# Patient Record
Sex: Female | Born: 1945
Health system: Southern US, Community
[De-identification: ages and names within clinical notes are randomized; demographics above are authoritative.]

## PROBLEM LIST (undated history)

## (undated) DIAGNOSIS — K5790 Diverticulosis of intestine, part unspecified, without perforation or abscess without bleeding: Secondary | ICD-10-CM

## (undated) DIAGNOSIS — M8580 Other specified disorders of bone density and structure, unspecified site: Secondary | ICD-10-CM

## (undated) DIAGNOSIS — N182 Chronic kidney disease, stage 2 (mild): Secondary | ICD-10-CM

## (undated) DIAGNOSIS — Z860101 Personal history of adenomatous and serrated colon polyps: Secondary | ICD-10-CM

## (undated) DIAGNOSIS — G5603 Carpal tunnel syndrome, bilateral upper limbs: Secondary | ICD-10-CM

## (undated) DIAGNOSIS — E782 Mixed hyperlipidemia: Secondary | ICD-10-CM

## (undated) DIAGNOSIS — Z86010 Personal history of colonic polyps: Secondary | ICD-10-CM

## (undated) DIAGNOSIS — M1990 Unspecified osteoarthritis, unspecified site: Secondary | ICD-10-CM

## (undated) DIAGNOSIS — R002 Palpitations: Secondary | ICD-10-CM

## (undated) DIAGNOSIS — F411 Generalized anxiety disorder: Secondary | ICD-10-CM

## (undated) DIAGNOSIS — I341 Nonrheumatic mitral (valve) prolapse: Secondary | ICD-10-CM

## (undated) DIAGNOSIS — C4491 Basal cell carcinoma of skin, unspecified: Secondary | ICD-10-CM

## (undated) DIAGNOSIS — Z800 Family history of malignant neoplasm of digestive organs: Secondary | ICD-10-CM

## (undated) DIAGNOSIS — I7300 Raynaud's syndrome without gangrene: Secondary | ICD-10-CM

## (undated) DIAGNOSIS — J309 Allergic rhinitis, unspecified: Secondary | ICD-10-CM

## (undated) DIAGNOSIS — I10 Essential (primary) hypertension: Secondary | ICD-10-CM

## (undated) HISTORY — DX: Other specified disorders of bone density and structure, unspecified site: M85.80

## (undated) HISTORY — DX: Basal cell carcinoma of skin, unspecified: C44.91

## (undated) HISTORY — DX: Raynaud's syndrome without gangrene: I73.00

## (undated) HISTORY — DX: Chronic kidney disease, stage 2 (mild): N18.2

## (undated) HISTORY — PX: OTHER SURGICAL HISTORY: SHX169

## (undated) HISTORY — DX: Personal history of adenomatous and serrated colon polyps: Z86.0101

## (undated) HISTORY — DX: Palpitations: R00.2

## (undated) HISTORY — DX: Essential (primary) hypertension: I10

## (undated) HISTORY — DX: Unspecified osteoarthritis, unspecified site: M19.90

## (undated) HISTORY — DX: Personal history of colon polyps: Z86.010

## (undated) HISTORY — DX: Carpal tunnel syndrome, bilateral upper limbs: G56.03

## (undated) HISTORY — DX: Family history of malignant neoplasm of digestive organs: Z80.0

## (undated) HISTORY — DX: Allergic rhinitis, unspecified: J30.9

## (undated) HISTORY — DX: Generalized anxiety disorder: F41.1

## (undated) HISTORY — DX: Diverticulosis of intestine, part unspecified, without perforation or abscess without bleeding: K57.90

## (undated) HISTORY — DX: Mixed hyperlipidemia: E78.2

## (undated) HISTORY — DX: Nonrheumatic mitral (valve) prolapse: I34.1

---

## 1977-11-11 HISTORY — PX: ABDOMINAL HYSTERECTOMY: SHX81

## 1977-11-11 HISTORY — PX: APPENDECTOMY: SHX54

## 1998-12-21 ENCOUNTER — Other Ambulatory Visit: Admission: RE | Admit: 1998-12-21 | Discharge: 1998-12-21 | Payer: Self-pay | Admitting: Obstetrics and Gynecology

## 2000-02-29 ENCOUNTER — Other Ambulatory Visit: Admission: RE | Admit: 2000-02-29 | Discharge: 2000-02-29 | Payer: Self-pay | Admitting: Obstetrics and Gynecology

## 2001-03-05 ENCOUNTER — Other Ambulatory Visit: Admission: RE | Admit: 2001-03-05 | Discharge: 2001-03-05 | Payer: Self-pay | Admitting: *Deleted

## 2005-11-11 HISTORY — PX: CARDIOVASCULAR STRESS TEST: SHX262

## 2011-12-13 HISTORY — PX: COLONOSCOPY: SHX174

## 2013-07-12 HISTORY — PX: CATARACT EXTRACTION: SUR2

## 2013-11-22 DIAGNOSIS — F411 Generalized anxiety disorder: Secondary | ICD-10-CM | POA: Diagnosis not present

## 2013-11-22 DIAGNOSIS — Z0000 Encounter for general adult medical examination without abnormal findings: Secondary | ICD-10-CM | POA: Diagnosis not present

## 2013-11-22 DIAGNOSIS — E782 Mixed hyperlipidemia: Secondary | ICD-10-CM | POA: Diagnosis not present

## 2013-12-17 DIAGNOSIS — IMO0002 Reserved for concepts with insufficient information to code with codable children: Secondary | ICD-10-CM | POA: Diagnosis not present

## 2013-12-17 DIAGNOSIS — M5137 Other intervertebral disc degeneration, lumbosacral region: Secondary | ICD-10-CM | POA: Diagnosis not present

## 2013-12-17 DIAGNOSIS — M5030 Other cervical disc degeneration, unspecified cervical region: Secondary | ICD-10-CM | POA: Diagnosis not present

## 2014-01-04 DIAGNOSIS — IMO0002 Reserved for concepts with insufficient information to code with codable children: Secondary | ICD-10-CM | POA: Diagnosis not present

## 2014-01-04 DIAGNOSIS — M5030 Other cervical disc degeneration, unspecified cervical region: Secondary | ICD-10-CM | POA: Diagnosis not present

## 2014-01-04 DIAGNOSIS — M5137 Other intervertebral disc degeneration, lumbosacral region: Secondary | ICD-10-CM | POA: Diagnosis not present

## 2014-01-31 DIAGNOSIS — Z961 Presence of intraocular lens: Secondary | ICD-10-CM | POA: Diagnosis not present

## 2014-02-10 DIAGNOSIS — M5030 Other cervical disc degeneration, unspecified cervical region: Secondary | ICD-10-CM | POA: Diagnosis not present

## 2014-02-10 DIAGNOSIS — IMO0002 Reserved for concepts with insufficient information to code with codable children: Secondary | ICD-10-CM | POA: Diagnosis not present

## 2014-02-10 DIAGNOSIS — M5137 Other intervertebral disc degeneration, lumbosacral region: Secondary | ICD-10-CM | POA: Diagnosis not present

## 2014-03-18 DIAGNOSIS — M5137 Other intervertebral disc degeneration, lumbosacral region: Secondary | ICD-10-CM | POA: Diagnosis not present

## 2014-03-18 DIAGNOSIS — M5030 Other cervical disc degeneration, unspecified cervical region: Secondary | ICD-10-CM | POA: Diagnosis not present

## 2014-03-18 DIAGNOSIS — IMO0002 Reserved for concepts with insufficient information to code with codable children: Secondary | ICD-10-CM | POA: Diagnosis not present

## 2014-04-06 DIAGNOSIS — IMO0002 Reserved for concepts with insufficient information to code with codable children: Secondary | ICD-10-CM | POA: Diagnosis not present

## 2014-04-06 DIAGNOSIS — M5030 Other cervical disc degeneration, unspecified cervical region: Secondary | ICD-10-CM | POA: Diagnosis not present

## 2014-04-06 DIAGNOSIS — M5137 Other intervertebral disc degeneration, lumbosacral region: Secondary | ICD-10-CM | POA: Diagnosis not present

## 2014-06-14 DIAGNOSIS — M5030 Other cervical disc degeneration, unspecified cervical region: Secondary | ICD-10-CM | POA: Diagnosis not present

## 2014-06-14 DIAGNOSIS — M999 Biomechanical lesion, unspecified: Secondary | ICD-10-CM | POA: Diagnosis not present

## 2014-06-14 DIAGNOSIS — M9981 Other biomechanical lesions of cervical region: Secondary | ICD-10-CM | POA: Diagnosis not present

## 2014-06-14 DIAGNOSIS — M545 Low back pain, unspecified: Secondary | ICD-10-CM | POA: Diagnosis not present

## 2014-06-16 DIAGNOSIS — M9981 Other biomechanical lesions of cervical region: Secondary | ICD-10-CM | POA: Diagnosis not present

## 2014-06-16 DIAGNOSIS — M999 Biomechanical lesion, unspecified: Secondary | ICD-10-CM | POA: Diagnosis not present

## 2014-06-16 DIAGNOSIS — M5030 Other cervical disc degeneration, unspecified cervical region: Secondary | ICD-10-CM | POA: Diagnosis not present

## 2014-06-16 DIAGNOSIS — M545 Low back pain, unspecified: Secondary | ICD-10-CM | POA: Diagnosis not present

## 2014-06-20 DIAGNOSIS — M999 Biomechanical lesion, unspecified: Secondary | ICD-10-CM | POA: Diagnosis not present

## 2014-06-20 DIAGNOSIS — M545 Low back pain, unspecified: Secondary | ICD-10-CM | POA: Diagnosis not present

## 2014-06-20 DIAGNOSIS — M9981 Other biomechanical lesions of cervical region: Secondary | ICD-10-CM | POA: Diagnosis not present

## 2014-06-20 DIAGNOSIS — M5030 Other cervical disc degeneration, unspecified cervical region: Secondary | ICD-10-CM | POA: Diagnosis not present

## 2014-06-22 DIAGNOSIS — M999 Biomechanical lesion, unspecified: Secondary | ICD-10-CM | POA: Diagnosis not present

## 2014-06-22 DIAGNOSIS — M545 Low back pain, unspecified: Secondary | ICD-10-CM | POA: Diagnosis not present

## 2014-06-22 DIAGNOSIS — M9981 Other biomechanical lesions of cervical region: Secondary | ICD-10-CM | POA: Diagnosis not present

## 2014-06-22 DIAGNOSIS — M5030 Other cervical disc degeneration, unspecified cervical region: Secondary | ICD-10-CM | POA: Diagnosis not present

## 2014-06-24 DIAGNOSIS — M545 Low back pain, unspecified: Secondary | ICD-10-CM | POA: Diagnosis not present

## 2014-06-24 DIAGNOSIS — M5030 Other cervical disc degeneration, unspecified cervical region: Secondary | ICD-10-CM | POA: Diagnosis not present

## 2014-06-24 DIAGNOSIS — M999 Biomechanical lesion, unspecified: Secondary | ICD-10-CM | POA: Diagnosis not present

## 2014-06-24 DIAGNOSIS — M9981 Other biomechanical lesions of cervical region: Secondary | ICD-10-CM | POA: Diagnosis not present

## 2014-07-07 DIAGNOSIS — Z01419 Encounter for gynecological examination (general) (routine) without abnormal findings: Secondary | ICD-10-CM | POA: Diagnosis not present

## 2014-08-04 DIAGNOSIS — Z23 Encounter for immunization: Secondary | ICD-10-CM | POA: Diagnosis not present

## 2014-08-30 ENCOUNTER — Ambulatory Visit (INDEPENDENT_AMBULATORY_CARE_PROVIDER_SITE_OTHER): Payer: Medicare Other | Admitting: Family Medicine

## 2014-08-30 ENCOUNTER — Encounter: Payer: Self-pay | Admitting: Family Medicine

## 2014-08-30 VITALS — BP 146/76 | HR 66 | Temp 98.5°F | Resp 18 | Ht 62.0 in | Wt 142.0 lb

## 2014-08-30 DIAGNOSIS — N951 Menopausal and female climacteric states: Secondary | ICD-10-CM | POA: Diagnosis not present

## 2014-08-30 MED ORDER — CITALOPRAM HYDROBROMIDE 20 MG PO TABS: 20.0000 mg | ORAL_TABLET | Freq: Every day | ORAL | Status: DC

## 2014-08-30 NOTE — Progress Notes (Signed)
Office Note 08/30/2014  CC:  Chief Complaint  Patient presents with  . Establish Care    moved from [place]    HPI:  Ziya Coonrod is a 68 y.o. White female who is here to establish care. Patient's most recent primary MD: Dr. Rolena Infante in [place], [place]. Old records were not reviewed prior to or during today's visit.  Last saw her PMD about 10 mo ago for CPE. Labs: all unremarkable.  Has hot flashes 3-4 times per day x yrs. Was on premarin x 10 yrs and still had hot flashes but not as bad. No sleep issues, no energy probs, no mood fluctuations.  Past Medical History  Diagnosis Date  . Arthritis   . Cancer   . Allergic rhinitis   . Heart murmur   . Hypertension   . Hyperlipidemia, mixed   . Colon polyp     Past Surgical History  Procedure Laterality Date  . Eye surgery    . Appendectomy    . Abdominal hysterectomy      Family History  Problem Relation Age of Onset  . Cancer Mother   . Heart attack Mother   . Heart disease Mother   . Stroke Father   . Hypertension Father   . Cancer Father   . Hypertension Sister   . Cancer Brother   . Hypertension Brother   . Heart attack Brother   . Pancreatitis Brother   . Hypertension Sister   . Myasthenia gravis Sister     History   Social History  . Marital Status: Married    Spouse Name: N/A    Number of Children: N/A  . Years of Education: N/A   Occupational History  . Not on file.   Social History Main Topics  . Smoking status: Never Smoker   . Smokeless tobacco: Never Used  . Alcohol Use: 0.6 oz/week    1 Glasses of wine per week     Comment: 4-5 times weekly  . Drug Use: No  . Sexual Activity: Not on file   Other Topics Concern  . Not on file   Social History Narrative  . No narrative on file    Outpatient Encounter Prescriptions as of 08/30/2014  Medication Sig  . Calcium Carbonate-Vit D-Min (CALCIUM 1200 PO) Take 1,200 mg by mouth.  . citalopram (CELEXA) 10 MG tablet Take 10 mg by mouth  daily.  . Omega-3 Fatty Acids (FISH OIL) 1200 MG CAPS Take 1,200 mg by mouth.  . simvastatin (ZOCOR) 20 MG tablet Take 20 mg by mouth 2 (two) times daily.    No Known Allergies  ROS Review of Systems  Constitutional: Negative for fever and fatigue.  HENT: Negative for congestion and sore throat.   Eyes: Negative for visual disturbance.  Respiratory: Negative for cough.   Cardiovascular: Negative for chest pain.  Gastrointestinal: Negative for nausea and abdominal pain.  Genitourinary: Negative for dysuria.  Musculoskeletal: Negative for back pain and joint swelling.  Skin: Negative for rash.  Neurological: Negative for weakness and headaches.  Hematological: Negative for adenopathy.    PE; Blood pressure 146/76, pulse 66, temperature 98.5 F (36.9 C), temperature source Temporal, resp. rate 18, height 5\' 2"  (1.575 m), weight 142 lb (64.411 kg), SpO2 97.00%. Wt Readings from Last 2 Encounters:  08/30/14 142 lb (64.411 kg)    Gen: alert, oriented x 4, affect pleasant.  Lucid thinking and conversation noted. HEENT: PERRLA, EOMI.   Neck: no LAD, mass, or thyromegaly. CV: RRR, no  m/r/g LUNGS: CTA bilat, nonlabored. NEURO: no tremor or tics noted on observation.  Coordination intact. CN 2-12 grossly intact bilaterally, strength 5/5 in all extremeties.  No ataxia. EXT: no clubbing, cyanosis, or edema.   Pertinent labs:  None today  ASSESSMENT AND PLAN:   New pt: obtain old records.  1) Vasomotor symptoms/hot flashes secondary to menopause. Spent a long time on premarin.  This is no longer an option. Will do trial of increased dose of her SSRI to see if this helps. Citalopram 20mg  qd, #30, RF x 6.  Will have her back in 3 mo for CPE, fasting labs the week prior. She has had flu vaccine already.   She deferred Tdap until next CPE.

## 2014-08-30 NOTE — Addendum Note (Signed)
Addended by: Tammi Sou on: 08/30/2014 10:18 AM   Modules accepted: Orders

## 2014-08-30 NOTE — Progress Notes (Signed)
Pre visit review using our clinic review tool, if applicable. No additional management support is needed unless otherwise documented below in the visit note. 

## 2014-10-24 DIAGNOSIS — M8588 Other specified disorders of bone density and structure, other site: Secondary | ICD-10-CM | POA: Diagnosis not present

## 2014-10-24 DIAGNOSIS — Z1231 Encounter for screening mammogram for malignant neoplasm of breast: Secondary | ICD-10-CM | POA: Diagnosis not present

## 2014-11-23 ENCOUNTER — Other Ambulatory Visit (INDEPENDENT_AMBULATORY_CARE_PROVIDER_SITE_OTHER): Payer: Medicare Other

## 2014-11-23 DIAGNOSIS — I10 Essential (primary) hypertension: Secondary | ICD-10-CM | POA: Diagnosis not present

## 2014-11-23 DIAGNOSIS — E785 Hyperlipidemia, unspecified: Secondary | ICD-10-CM

## 2014-11-23 LAB — CBC WITH DIFFERENTIAL/PLATELET
Basophils Absolute: 0 K/uL (ref 0.0–0.1)
Basophils Relative: 0.5 % (ref 0.0–3.0)
Eosinophils Absolute: 0.2 K/uL (ref 0.0–0.7)
Eosinophils Relative: 3.5 % (ref 0.0–5.0)
HCT: 43 % (ref 36.0–46.0)
Hemoglobin: 14.2 g/dL (ref 12.0–15.0)
Lymphocytes Relative: 34.5 % (ref 12.0–46.0)
Lymphs Abs: 2.2 K/uL (ref 0.7–4.0)
MCHC: 33.1 g/dL (ref 30.0–36.0)
MCV: 90.2 fl (ref 78.0–100.0)
Monocytes Absolute: 0.5 K/uL (ref 0.1–1.0)
Monocytes Relative: 8 % (ref 3.0–12.0)
Neutro Abs: 3.3 K/uL (ref 1.4–7.7)
Neutrophils Relative %: 53.5 % (ref 43.0–77.0)
Platelets: 201 K/uL (ref 150.0–400.0)
RBC: 4.77 Mil/uL (ref 3.87–5.11)
RDW: 12.9 % (ref 11.5–15.5)
WBC: 6.3 K/uL (ref 4.0–10.5)

## 2014-11-23 LAB — COMPREHENSIVE METABOLIC PANEL WITH GFR
ALT: 15 U/L (ref 0–35)
AST: 18 U/L (ref 0–37)
Albumin: 3.9 g/dL (ref 3.5–5.2)
Alkaline Phosphatase: 74 U/L (ref 39–117)
BUN: 17 mg/dL (ref 6–23)
CO2: 31 meq/L (ref 19–32)
Calcium: 9.3 mg/dL (ref 8.4–10.5)
Chloride: 103 meq/L (ref 96–112)
Creatinine, Ser: 0.92 mg/dL (ref 0.40–1.20)
GFR: 64.78 mL/min
Glucose, Bld: 88 mg/dL (ref 70–99)
Potassium: 4 meq/L (ref 3.5–5.1)
Sodium: 138 meq/L (ref 135–145)
Total Bilirubin: 0.8 mg/dL (ref 0.2–1.2)
Total Protein: 6.4 g/dL (ref 6.0–8.3)

## 2014-11-23 LAB — LIPID PANEL
Cholesterol: 166 mg/dL (ref 0–200)
HDL: 46.1 mg/dL
LDL Cholesterol: 94 mg/dL (ref 0–99)
NonHDL: 119.9
Total CHOL/HDL Ratio: 4
Triglycerides: 128 mg/dL (ref 0.0–149.0)
VLDL: 25.6 mg/dL (ref 0.0–40.0)

## 2014-11-23 LAB — TSH: TSH: 3.56 u[IU]/mL (ref 0.35–4.50)

## 2014-11-30 ENCOUNTER — Encounter: Payer: Self-pay | Admitting: Family Medicine

## 2014-11-30 ENCOUNTER — Ambulatory Visit (INDEPENDENT_AMBULATORY_CARE_PROVIDER_SITE_OTHER): Payer: Medicare Other | Admitting: Family Medicine

## 2014-11-30 VITALS — BP 146/82 | HR 58 | Temp 97.2°F | Ht 62.0 in | Wt 141.0 lb

## 2014-11-30 DIAGNOSIS — Z0000 Encounter for general adult medical examination without abnormal findings: Secondary | ICD-10-CM | POA: Diagnosis not present

## 2014-11-30 DIAGNOSIS — Z23 Encounter for immunization: Secondary | ICD-10-CM | POA: Diagnosis not present

## 2014-11-30 NOTE — Assessment & Plan Note (Signed)
Reviewed age and gender appropriate health maintenance issues (prudent diet, regular exercise, health risks of tobacco and excessive alcohol, use of seatbelts, fire alarms in home, use of sunscreen).  Also reviewed age and gender appropriate health screening as well as vaccine recommendations. Recent HP labs reviewed with pt: all normal. Colon ca screening UTD. Cervical ca screening not applicable. Breast ca screening and bone density screening UTD. Pt wants to think about prevnar 13 but she agreed to get Tdap booster today.

## 2014-11-30 NOTE — Progress Notes (Signed)
Pre visit review using our clinic review tool, if applicable. No additional management support is needed unless otherwise documented below in the visit note. 

## 2014-11-30 NOTE — Addendum Note (Signed)
Addended by: Julieta Bellini on: 11/30/2014 11:48 AM   Modules accepted: Orders

## 2014-11-30 NOTE — Progress Notes (Signed)
Office Note 11/30/2014  CC:  Chief Complaint  Patient presents with  . Annual Exam    HPI:  Kathy Richardson is a 69 y.o. White female who is here for annual CPE. Eye exam UTD: cataract surgery on both eyes 1 yr ago. Dental visits q54mo. Exercising 45 min several days per week (cardio).  She got mammo (normal) recently through her GYn as well as DEXA (osteopenia, was told to continue Vit D and calcium and increase wt bearing exercise, plan to repeat 2 yrs)  Past Medical History  Diagnosis Date  . Osteoarthritis     Fingers and hips.  . Basal cell carcinoma     X 3: one on each leg and one on right shoulder  . Allergic rhinitis   . Mitral valve prolapse   . Hypertension     took meds x 5mo.  Came off meds and bp ok.  . Hyperlipidemia, mixed   . Colon polyp 2008; 2013    Recall 5 yrs per pt report  . Generalized anxiety disorder     Past Surgical History  Procedure Laterality Date  . Cataract extraction  07/2013  . Appendectomy  1979  . Abdominal hysterectomy  1979    Done for DUB.  Ovaries are still in.  No further paps necessary.  . Colonoscopy  12/2011    Recall 5 yrs    Family History  Problem Relation Age of Onset  . Breast cancer Mother     dx'd age 82  . Heart attack Mother   . Heart disease Mother   . Stroke Father   . Hypertension Father   . Cancer Father   . Hypertension Sister   . Cancer Brother   . Hypertension Brother   . Heart attack Brother   . Pancreatitis Brother   . Hypertension Sister   . Myasthenia gravis Sister     History   Social History  . Marital Status: Married    Spouse Name: N/A    Number of Children: N/A  . Years of Education: N/A   Occupational History  . Not on file.   Social History Main Topics  . Smoking status: Never Smoker   . Smokeless tobacco: Never Used  . Alcohol Use: 0.6 oz/week    1 Glasses of wine per week     Comment: 4-5 times weekly  . Drug Use: No  . Sexual Activity: Not on file   Other Topics  Concern  . Not on file   Social History Narrative   Married, 1 son, 1 grand-daughter.   Orig from [place], then [place] for 30 yrs, relocated back to [place].   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   NO tob.  Alc: 1 glass wine 4-5 times per week.    Outpatient Prescriptions Prior to Visit  Medication Sig Dispense Refill  . Calcium Carbonate-Vit D-Min (CALCIUM 1200 PO) Take 1,200 mg by mouth.    . Omega-3 Fatty Acids (FISH OIL) 1200 MG CAPS Take 1,200 mg by mouth.    . simvastatin (ZOCOR) 20 MG tablet Take 20 mg by mouth 2 (two) times daily.    . citalopram (CELEXA) 20 MG tablet Take 1 tablet (20 mg total) by mouth daily. (Patient not taking: Reported on 11/30/2014) 30 tablet 6   No facility-administered medications prior to visit.    No Known Allergies  ROS Review of Systems  Constitutional: Negative for fever, chills, appetite change and  fatigue.  HENT: Negative for congestion, dental problem, ear pain and sore throat.   Eyes: Negative for discharge, redness and visual disturbance.  Respiratory: Negative for cough, chest tightness, shortness of breath and wheezing.   Cardiovascular: Negative for chest pain, palpitations and leg swelling.  Gastrointestinal: Negative for nausea, vomiting, abdominal pain, diarrhea and blood in stool.  Genitourinary: Negative for dysuria, urgency, frequency, hematuria, flank pain and difficulty urinating.  Musculoskeletal: Negative for myalgias, back pain, joint swelling, arthralgias and neck stiffness.  Skin: Negative for pallor and rash.  Neurological: Negative for dizziness, speech difficulty, weakness and headaches.  Hematological: Negative for adenopathy. Does not bruise/bleed easily.  Psychiatric/Behavioral: Negative for confusion and sleep disturbance. The patient is not nervous/anxious.     PE; Blood pressure 146/82, pulse 58, temperature 97.2 F (36.2 C), temperature source Temporal, height 5'  2" (1.575 m), weight 141 lb (63.957 kg), SpO2 97 %.  Examined pt with Ival Bible, CMA, as chaperone. Gen: Alert, well appearing.  Patient is oriented to person, place, time, and situation. AFFECT: pleasant, lucid thought and speech. ENT: Ears: EACs clear, normal epithelium.  TMs with good light reflex and landmarks bilaterally.  Eyes: no injection, icteris, swelling, or exudate.  EOMI, PERRLA. Nose: no drainage or turbinate edema/swelling.  No injection or focal lesion.  Mouth: lips without lesion/swelling.  Oral mucosa pink and moist.  Dentition intact and without obvious caries or gingival swelling.  Oropharynx without erythema, exudate, or swelling.  Neck: supple/nontender.  No LAD, mass, or TM.  Carotid pulses 2+ bilaterally, without bruits. CV: RRR, no m/r/g.   LUNGS: CTA bilat, nonlabored resps, good aeration in all lung fields. ABD: soft, NT, ND, BS normal.  No hepatospenomegaly or mass.  No bruits. EXT: no clubbing, cyanosis, or edema.  Musculoskeletal: no joint swelling, erythema, warmth, or tenderness.  ROM of all joints intact. Skin - no sores or suspicious lesions or rashes or color changes   Pertinent labs:  Lab Results  Component Value Date   WBC 6.3 11/23/2014   HGB 14.2 11/23/2014   HCT 43.0 11/23/2014   MCV 90.2 11/23/2014   PLT 201.0 11/23/2014   Lab Results  Component Value Date   CHOL 166 11/23/2014   HDL 46.10 11/23/2014   LDLCALC 94 11/23/2014   TRIG 128.0 11/23/2014   CHOLHDL 4 11/23/2014   Lab Results  Component Value Date   TSH 3.56 11/23/2014     Chemistry      Component Value Date/Time   NA 138 11/23/2014 0809   K 4.0 11/23/2014 0809   CL 103 11/23/2014 0809   CO2 31 11/23/2014 0809   BUN 17 11/23/2014 0809   CREATININE 0.92 11/23/2014 0809      Component Value Date/Time   CALCIUM 9.3 11/23/2014 0809   ALKPHOS 74 11/23/2014 0809   AST 18 11/23/2014 0809   ALT 15 11/23/2014 0809   BILITOT 0.8 11/23/2014 0809      ASSESSMENT AND  PLAN:   Health maintenance examination Reviewed age and gender appropriate health maintenance issues (prudent diet, regular exercise, health risks of tobacco and excessive alcohol, use of seatbelts, fire alarms in home, use of sunscreen).  Also reviewed age and gender appropriate health screening as well as vaccine recommendations. Recent HP labs reviewed with pt: all normal. Colon ca screening UTD. Cervical ca screening not applicable. Breast ca screening and bone density screening UTD. Pt wants to think about prevnar 13 but she agreed to get Tdap booster today.   An  After Visit Summary was printed and given to the patient.  FOLLOW UP:  Return for 1 year for annual medicare wellness visit.

## 2014-12-02 ENCOUNTER — Encounter: Payer: Self-pay | Admitting: Family Medicine

## 2014-12-05 ENCOUNTER — Telehealth: Payer: Self-pay | Admitting: *Deleted

## 2014-12-05 MED ORDER — SIMVASTATIN 20 MG PO TABS: 20.0000 mg | ORAL_TABLET | Freq: Two times a day (BID) | ORAL | Status: DC

## 2014-12-05 NOTE — Telephone Encounter (Signed)
Rx sent to pharmacy for Simvastatin.

## 2014-12-06 ENCOUNTER — Other Ambulatory Visit: Payer: Self-pay | Admitting: Family Medicine

## 2014-12-06 MED ORDER — SIMVASTATIN 20 MG PO TABS: 20.0000 mg | ORAL_TABLET | Freq: Two times a day (BID) | ORAL | Status: DC

## 2014-12-07 ENCOUNTER — Other Ambulatory Visit: Payer: Self-pay | Admitting: Family Medicine

## 2014-12-07 MED ORDER — SIMVASTATIN 20 MG PO TABS: 20.0000 mg | ORAL_TABLET | Freq: Every day | ORAL | Status: DC

## 2015-03-26 ENCOUNTER — Other Ambulatory Visit: Payer: Self-pay | Admitting: Family Medicine

## 2015-04-04 DIAGNOSIS — M5136 Other intervertebral disc degeneration, lumbar region: Secondary | ICD-10-CM | POA: Diagnosis not present

## 2015-04-04 DIAGNOSIS — M9904 Segmental and somatic dysfunction of sacral region: Secondary | ICD-10-CM | POA: Diagnosis not present

## 2015-04-04 DIAGNOSIS — M9905 Segmental and somatic dysfunction of pelvic region: Secondary | ICD-10-CM | POA: Diagnosis not present

## 2015-04-04 DIAGNOSIS — M9903 Segmental and somatic dysfunction of lumbar region: Secondary | ICD-10-CM | POA: Diagnosis not present

## 2015-05-08 ENCOUNTER — Other Ambulatory Visit: Payer: Self-pay

## 2015-06-11 ENCOUNTER — Encounter (HOSPITAL_BASED_OUTPATIENT_CLINIC_OR_DEPARTMENT_OTHER): Payer: Self-pay | Admitting: *Deleted

## 2015-06-11 ENCOUNTER — Emergency Department (HOSPITAL_BASED_OUTPATIENT_CLINIC_OR_DEPARTMENT_OTHER)
Admission: EM | Admit: 2015-06-11 | Discharge: 2015-06-11 | Disposition: A | Discharge status: Home / Self Care | Payer: No Typology Code available for payment source | Attending: Emergency Medicine | Admitting: Emergency Medicine

## 2015-06-11 ENCOUNTER — Emergency Department (HOSPITAL_BASED_OUTPATIENT_CLINIC_OR_DEPARTMENT_OTHER): Payer: No Typology Code available for payment source

## 2015-06-11 DIAGNOSIS — Y9389 Activity, other specified: Secondary | ICD-10-CM | POA: Diagnosis not present

## 2015-06-11 DIAGNOSIS — Z86010 Personal history of colonic polyps: Secondary | ICD-10-CM | POA: Diagnosis not present

## 2015-06-11 DIAGNOSIS — Z85828 Personal history of other malignant neoplasm of skin: Secondary | ICD-10-CM | POA: Diagnosis not present

## 2015-06-11 DIAGNOSIS — Z8739 Personal history of other diseases of the musculoskeletal system and connective tissue: Secondary | ICD-10-CM | POA: Insufficient documentation

## 2015-06-11 DIAGNOSIS — Y998 Other external cause status: Secondary | ICD-10-CM | POA: Insufficient documentation

## 2015-06-11 DIAGNOSIS — S51012A Laceration without foreign body of left elbow, initial encounter: Secondary | ICD-10-CM | POA: Diagnosis not present

## 2015-06-11 DIAGNOSIS — T148XXA Other injury of unspecified body region, initial encounter: Secondary | ICD-10-CM

## 2015-06-11 DIAGNOSIS — Y92410 Unspecified street and highway as the place of occurrence of the external cause: Secondary | ICD-10-CM | POA: Diagnosis not present

## 2015-06-11 DIAGNOSIS — F411 Generalized anxiety disorder: Secondary | ICD-10-CM | POA: Insufficient documentation

## 2015-06-11 DIAGNOSIS — E782 Mixed hyperlipidemia: Secondary | ICD-10-CM | POA: Insufficient documentation

## 2015-06-11 DIAGNOSIS — S59902A Unspecified injury of left elbow, initial encounter: Secondary | ICD-10-CM | POA: Diagnosis not present

## 2015-06-11 DIAGNOSIS — S299XXA Unspecified injury of thorax, initial encounter: Secondary | ICD-10-CM | POA: Diagnosis not present

## 2015-06-11 DIAGNOSIS — S30811A Abrasion of abdominal wall, initial encounter: Secondary | ICD-10-CM | POA: Insufficient documentation

## 2015-06-11 DIAGNOSIS — I10 Essential (primary) hypertension: Secondary | ICD-10-CM | POA: Diagnosis not present

## 2015-06-11 DIAGNOSIS — Z8709 Personal history of other diseases of the respiratory system: Secondary | ICD-10-CM | POA: Insufficient documentation

## 2015-06-11 DIAGNOSIS — Z79899 Other long term (current) drug therapy: Secondary | ICD-10-CM | POA: Diagnosis not present

## 2015-06-11 DIAGNOSIS — S301XXA Contusion of abdominal wall, initial encounter: Secondary | ICD-10-CM | POA: Diagnosis not present

## 2015-06-11 DIAGNOSIS — S20219A Contusion of unspecified front wall of thorax, initial encounter: Secondary | ICD-10-CM | POA: Insufficient documentation

## 2015-06-11 DIAGNOSIS — R079 Chest pain, unspecified: Secondary | ICD-10-CM | POA: Diagnosis not present
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
## 2015-06-11 NOTE — Discharge Instructions (Signed)

## 2015-06-28 DIAGNOSIS — L570 Actinic keratosis: Secondary | ICD-10-CM | POA: Diagnosis not present

## 2015-06-28 DIAGNOSIS — D1801 Hemangioma of skin and subcutaneous tissue: Secondary | ICD-10-CM | POA: Diagnosis not present

## 2015-06-28 DIAGNOSIS — L814 Other melanin hyperpigmentation: Secondary | ICD-10-CM | POA: Diagnosis not present

## 2015-06-28 DIAGNOSIS — Z85828 Personal history of other malignant neoplasm of skin: Secondary | ICD-10-CM | POA: Diagnosis not present

## 2015-06-28 DIAGNOSIS — L821 Other seborrheic keratosis: Secondary | ICD-10-CM | POA: Diagnosis not present

## 2015-06-28 DIAGNOSIS — Q825 Congenital non-neoplastic nevus: Secondary | ICD-10-CM | POA: Diagnosis not present

## 2015-06-28 DIAGNOSIS — D485 Neoplasm of uncertain behavior of skin: Secondary | ICD-10-CM | POA: Diagnosis not present

## 2015-06-29 DIAGNOSIS — C44722 Squamous cell carcinoma of skin of right lower limb, including hip: Secondary | ICD-10-CM | POA: Diagnosis not present

## 2015-06-29 DIAGNOSIS — L439 Lichen planus, unspecified: Secondary | ICD-10-CM | POA: Diagnosis not present

## 2015-07-24 DIAGNOSIS — C44722 Squamous cell carcinoma of skin of right lower limb, including hip: Secondary | ICD-10-CM | POA: Diagnosis not present

## 2015-07-25 DIAGNOSIS — L905 Scar conditions and fibrosis of skin: Secondary | ICD-10-CM | POA: Diagnosis not present

## 2015-09-13 ENCOUNTER — Other Ambulatory Visit: Payer: Self-pay | Admitting: Family Medicine

## 2015-09-13 DIAGNOSIS — Z23 Encounter for immunization: Secondary | ICD-10-CM | POA: Diagnosis not present

## 2015-09-13 NOTE — Telephone Encounter (Signed)
RF request for citalopram LOV: 11/30/14 Next ov: None Last written: 08/30/14 #30 w/ 6RF

## 2015-10-23 ENCOUNTER — Other Ambulatory Visit: Payer: Self-pay | Admitting: Family Medicine

## 2015-11-16 DIAGNOSIS — Z124 Encounter for screening for malignant neoplasm of cervix: Secondary | ICD-10-CM | POA: Diagnosis not present

## 2015-11-16 DIAGNOSIS — Z1231 Encounter for screening mammogram for malignant neoplasm of breast: Secondary | ICD-10-CM | POA: Diagnosis not present

## 2015-11-16 DIAGNOSIS — Z6826 Body mass index (BMI) 26.0-26.9, adult: Secondary | ICD-10-CM | POA: Diagnosis not present

## 2016-01-04 DIAGNOSIS — B373 Candidiasis of vulva and vagina: Secondary | ICD-10-CM | POA: Diagnosis not present

## 2016-01-04 DIAGNOSIS — J0100 Acute maxillary sinusitis, unspecified: Secondary | ICD-10-CM | POA: Diagnosis not present

## 2016-01-15 ENCOUNTER — Other Ambulatory Visit: Payer: Self-pay | Admitting: Family Medicine
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
## 2016-01-16 NOTE — Telephone Encounter (Signed)
rx

## 2016-02-02 ENCOUNTER — Encounter: Payer: Self-pay | Admitting: Family Medicine

## 2016-02-02 ENCOUNTER — Ambulatory Visit (INDEPENDENT_AMBULATORY_CARE_PROVIDER_SITE_OTHER): Payer: Medicare Other | Admitting: Family Medicine

## 2016-02-02 VITALS — BP 128/76 | HR 54 | Temp 98.0°F | Resp 16 | Ht 62.0 in | Wt 144.0 lb

## 2016-02-02 DIAGNOSIS — Z0000 Encounter for general adult medical examination without abnormal findings: Secondary | ICD-10-CM | POA: Diagnosis not present

## 2016-02-02 DIAGNOSIS — Z23 Encounter for immunization: Secondary | ICD-10-CM

## 2016-02-02 DIAGNOSIS — N182 Chronic kidney disease, stage 2 (mild): Secondary | ICD-10-CM | POA: Diagnosis not present

## 2016-02-02 DIAGNOSIS — E785 Hyperlipidemia, unspecified: Secondary | ICD-10-CM

## 2016-02-02 LAB — COMPREHENSIVE METABOLIC PANEL
ALT: 16 U/L (ref 0–35)
AST: 17 U/L (ref 0–37)
Albumin: 4.2 g/dL (ref 3.5–5.2)
Alkaline Phosphatase: 72 U/L (ref 39–117)
BILIRUBIN TOTAL: 0.7 mg/dL (ref 0.2–1.2)
BUN: 17 mg/dL (ref 6–23)
CHLORIDE: 105 meq/L (ref 96–112)
CO2: 29 meq/L (ref 19–32)
CREATININE: 0.89 mg/dL (ref 0.40–1.20)
Calcium: 9.5 mg/dL (ref 8.4–10.5)
GFR: 67.07 mL/min (ref >60.00)
GLUCOSE: 99 mg/dL (ref 70–99)
Potassium: 4.3 mEq/L (ref 3.5–5.1)
SODIUM: 140 meq/L (ref 135–145)
Total Protein: 6.5 g/dL (ref 6.0–8.3)

## 2016-02-02 LAB — LIPID PANEL
Cholesterol: 180 mg/dL (ref 0–200)
HDL: 41.3 mg/dL (ref >39.00)
LDL Cholesterol: 116 mg/dL — ABNORMAL HIGH (ref 0–99)
NonHDL: 138.77
Total CHOL/HDL Ratio: 4
Triglycerides: 112 mg/dL (ref 0.0–149.0)
VLDL: 22.4 mg/dL (ref 0.0–40.0)

## 2016-02-02 MED ORDER — ZOSTER VACCINE LIVE 19400 UNT/0.65ML ~~LOC~~ SOLR: 0.6500 mL | Freq: Once | SUBCUTANEOUS | Status: DC

## 2016-02-02 NOTE — Progress Notes (Signed)
Pre visit review using our clinic review tool, if applicable. No additional management support is needed unless otherwise documented below in the visit note. 

## 2016-02-02 NOTE — Progress Notes (Signed)
The patient is here for annual Medicare wellness examination and management of other chronic and acute problems. Other problems discussed today: none, although we did routine f/u labs for her mild CRI (stage II) and her hyperlipidemia.   AWV DATA The risk factors are reflected in the social history.  The roster of all physicians providing medical care to patient is listed in the Snapshot section of the chart.  Activities of daily living:  The patient is 100% independent in all ADLs: dressing, toileting, feeding as well as independent mobility.  Home safety : The patient has smoke detectors in the home. They wear seatbelts. No firearms at home ( firearms are present in the home, kept in a safe fashion). There is no violence in the home.   There is no risks for hepatitis, STDs or HIV. There is no history of blood transfusion. They have no travel history to infectious disease endemic areas of the world.  The patient has seen their dentist in the last six months. They have not seen their eye doctor in the last year but this is scheduled for 1 week from now. They deny any hearing difficulty and have not had audiologic testing in the last year.  They do not  have excessive sun exposure. Discussed the need for sun protection: hats, long sleeves and use of sunscreen if there is significant sun exposure.   Diet: the importance of a healthy diet is discussed. They do have a healthy diet.  The patient has a regular exercise program:walks 3 miles 4-5 times per week.  The benefits of regular aerobic exercise were discussed.  Depression screen: there are no signs or vegative symptoms of depression- irritability, change in appetite, anhedonia, sadness/tearfullness.  Cognitive assessment: the patient manages all their financial and personal affairs and is actively engaged. They could relate day,date,year and events; recalled 3/3 objects at 3 minutes; performed clock-face test normally.  Reviewed advanced  directives with pt today.  Pt has this.  The following portions of the patient's history were reviewed and updated as appropriate: allergies, current medications, past family history, past medical history,  past surgical history, past social history  and problem list.  Vision, hearing, body mass index were assessed and reviewed.   During the course of the visit the patient was educated and counseled about appropriate screening and preventive services including :  Annual wellness visit --today. diabetes screening--will do today colorectal cancer screening: will arrange in future (for after 12/2016) recommended immunizations (influenza, pneumococcal, Hep B) Bone mass measurement: next one to be done after 11/2016 with her GYN office. Counseling to prevent tobacco use: N/A Depression screening: today Glaucoma screening: at her eye MD Hepatitis C virus screening: declines HIV virus screening: declines Lung cancer screening: n/a Medical nutrition therapy: n/a Prostate cancer screening: n/a Screening mammography: gets with GYN, had one 11/16/2015--wnl. Screening pap tests, pelvic exam, and clinical breast exam: done 11/16/15 so next would be due 11/2017. Ultrasound screening for AAA: n/a  A written plan of action regarding the above screening and preventative services was given to the patient today. PLAN: fasting glucose screening today.   After 11/2016 she'll get another DEXA via her GYN's office. After 12/2016 she'll need another colonoscopy--she'll need referral to local GI MD b/c her last colonoscopy was in [place].  Hyperlipidemia: FLP and AST/ALT check today.  Tolerating statin well. CRI stage II (GFR 65 ml/min last year): recheck BMET today.   F/u 1 yr for AWV.  Signed:  Crissie Sickles, MD  02/02/2016  

## 2016-02-05 NOTE — Telephone Encounter (Signed)
Please see mychart message from pt. Thanks. 

## 2016-02-16 DIAGNOSIS — H26491 Other secondary cataract, right eye: Secondary | ICD-10-CM | POA: Diagnosis not present

## 2016-02-16 DIAGNOSIS — H5203 Hypermetropia, bilateral: Secondary | ICD-10-CM | POA: Diagnosis not present

## 2016-02-26 DIAGNOSIS — M9902 Segmental and somatic dysfunction of thoracic region: Secondary | ICD-10-CM | POA: Diagnosis not present

## 2016-02-26 DIAGNOSIS — M9901 Segmental and somatic dysfunction of cervical region: Secondary | ICD-10-CM | POA: Diagnosis not present

## 2016-02-26 DIAGNOSIS — M9903 Segmental and somatic dysfunction of lumbar region: Secondary | ICD-10-CM | POA: Diagnosis not present

## 2016-02-26 DIAGNOSIS — M50322 Other cervical disc degeneration at C5-C6 level: Secondary | ICD-10-CM | POA: Diagnosis not present

## 2016-05-09 DIAGNOSIS — M9905 Segmental and somatic dysfunction of pelvic region: Secondary | ICD-10-CM | POA: Diagnosis not present

## 2016-05-09 DIAGNOSIS — M9904 Segmental and somatic dysfunction of sacral region: Secondary | ICD-10-CM | POA: Diagnosis not present

## 2016-05-09 DIAGNOSIS — M9903 Segmental and somatic dysfunction of lumbar region: Secondary | ICD-10-CM | POA: Diagnosis not present

## 2016-05-09 DIAGNOSIS — M5136 Other intervertebral disc degeneration, lumbar region: Secondary | ICD-10-CM | POA: Diagnosis not present

## 2016-05-21 ENCOUNTER — Other Ambulatory Visit: Payer: Self-pay | Admitting: Family Medicine

## 2016-05-21 NOTE — Telephone Encounter (Signed)
RF request for simvastatin LOV: 02/02/16 Next ov: 02/03/17 Last written: 10/23/15 #30 w/ 2RF

## 2016-08-16 DIAGNOSIS — Z23 Encounter for immunization: Secondary | ICD-10-CM | POA: Diagnosis not present

## 2016-08-31 ENCOUNTER — Other Ambulatory Visit: Payer: Self-pay | Admitting: Family Medicine

## 2016-09-02 NOTE — Telephone Encounter (Signed)
Okay to fill Citalopram?  Patient last OV was 02/02/2016 but states to f/u in 1 year.  Please advise.

## 2016-10-22 DIAGNOSIS — M5134 Other intervertebral disc degeneration, thoracic region: Secondary | ICD-10-CM | POA: Diagnosis not present

## 2016-10-22 DIAGNOSIS — M9902 Segmental and somatic dysfunction of thoracic region: Secondary | ICD-10-CM | POA: Diagnosis not present

## 2016-10-22 DIAGNOSIS — M50322 Other cervical disc degeneration at C5-C6 level: Secondary | ICD-10-CM | POA: Diagnosis not present

## 2016-10-22 DIAGNOSIS — M9901 Segmental and somatic dysfunction of cervical region: Secondary | ICD-10-CM | POA: Diagnosis not present

## 2016-10-24 DIAGNOSIS — M50322 Other cervical disc degeneration at C5-C6 level: Secondary | ICD-10-CM | POA: Diagnosis not present

## 2016-10-24 DIAGNOSIS — M5134 Other intervertebral disc degeneration, thoracic region: Secondary | ICD-10-CM | POA: Diagnosis not present

## 2016-10-24 DIAGNOSIS — M9902 Segmental and somatic dysfunction of thoracic region: Secondary | ICD-10-CM | POA: Diagnosis not present

## 2016-10-24 DIAGNOSIS — M9901 Segmental and somatic dysfunction of cervical region: Secondary | ICD-10-CM | POA: Diagnosis not present

## 2016-10-29 DIAGNOSIS — M9901 Segmental and somatic dysfunction of cervical region: Secondary | ICD-10-CM | POA: Diagnosis not present

## 2016-10-29 DIAGNOSIS — M5134 Other intervertebral disc degeneration, thoracic region: Secondary | ICD-10-CM | POA: Diagnosis not present

## 2016-10-29 DIAGNOSIS — M9902 Segmental and somatic dysfunction of thoracic region: Secondary | ICD-10-CM | POA: Diagnosis not present

## 2016-10-29 DIAGNOSIS — M50322 Other cervical disc degeneration at C5-C6 level: Secondary | ICD-10-CM | POA: Diagnosis not present

## 2016-11-13 ENCOUNTER — Encounter: Payer: Self-pay | Admitting: Family Medicine

## 2016-11-13 ENCOUNTER — Ambulatory Visit (INDEPENDENT_AMBULATORY_CARE_PROVIDER_SITE_OTHER): Payer: Medicare Other | Admitting: Family Medicine

## 2016-11-13 VITALS — BP 155/83 | HR 116 | Temp 98.7°F | Resp 20 | Wt 148.5 lb

## 2016-11-13 DIAGNOSIS — J0100 Acute maxillary sinusitis, unspecified: Secondary | ICD-10-CM

## 2016-11-13 MED ORDER — AMOXICILLIN-POT CLAVULANATE 875-125 MG PO TABS: 1.0000 | ORAL_TABLET | Freq: Two times a day (BID) | ORAL | 0 refills | Status: DC

## 2016-11-13 NOTE — Patient Instructions (Signed)
Rest, hydrate.  Mucinex, flonase use Augmentin prescribed every 12 hours for 10 days.    Sinusitis, Adult Sinusitis is soreness and inflammation of your sinuses. Sinuses are hollow spaces in the bones around your face. They are located:  Around your eyes.  In the middle of your forehead.  Behind your nose.  In your cheekbones. Your sinuses and nasal passages are lined with a stringy fluid (mucus). Mucus normally drains out of your sinuses. When your nasal tissues get inflamed or swollen, the mucus can get trapped or blocked so air cannot flow through your sinuses. This lets bacteria, viruses, and funguses grow, and that leads to infection. Follow these instructions at home: Medicines  Take, use, or apply over-the-counter and prescription medicines only as told by your doctor. These may include nasal sprays.  If you were prescribed an antibiotic medicine, take it as told by your doctor. Do not stop taking the antibiotic even if you start to feel better. Hydrate and Humidify  Drink enough water to keep your pee (urine) clear or pale yellow.  Use a cool mist humidifier to keep the humidity level in your home above 50%.  Breathe in steam for 10-15 minutes, 3-4 times a day or as told by your doctor. You can do this in the bathroom while a hot shower is running.  Try not to spend time in cool or dry air. Rest  Rest as much as possible.  Sleep with your head raised (elevated).  Make sure to get enough sleep each night. General instructions  Put a warm, moist washcloth on your face 3-4 times a day or as told by your doctor. This will help with discomfort.  Wash your hands often with soap and water. If there is no soap and water, use hand sanitizer.  Do not smoke. Avoid being around people who are smoking (secondhand smoke).  Keep all follow-up visits as told by your doctor. This is important. Contact a doctor if:  You have a fever.  Your symptoms get worse.  Your symptoms  do not get better within 10 days. Get help right away if:  You have a very bad headache.  You cannot stop throwing up (vomiting).  You have pain or swelling around your face or eyes.  You have trouble seeing.  You feel confused.  Your neck is stiff.  You have trouble breathing. This information is not intended to replace advice given to you by your health care provider. Make sure you discuss any questions you have with your health care provider. Document Released: 04/15/2008 Document Revised: 06/23/2016 Document Reviewed: 08/23/2015 Elsevier Interactive Patient Education  2017 [employer].

## 2016-11-13 NOTE — Progress Notes (Signed)
Kathy Richardson , Sep 14, 1946, 71 y.o., female MRN: [id number] Patient Care Team    Relationship Specialty Notifications Start End  Tammi Sou, MD PCP - General Family Medicine  08/30/14   Devra Dopp, MD Consulting Physician Dermatology  02/02/16   Jola Schmidt, MD Consulting Physician Ophthalmology  02/02/16     CC: chest congestion Subjective: Pt presents for an acute OV with complaints of chest congestion of 10 days duration.  Associated symptoms include green drainage, cough and hoarseness. She denies chills, nausea, vomit, diarrhea. Flu shot UTD. No sick contacts. Pt has tried mucinex, advil and sinus tab to ease their symptoms. Pt felt she was improving, but symptoms returned and worsened.  No Known Allergies Social History  Substance Use Topics  . Smoking status: Never Smoker  . Smokeless tobacco: Never Used  . Alcohol use 0.6 oz/week    1 Glasses of wine per week     Comment: 4-5 times weekly   Past Medical History:  Diagnosis Date  . Allergic rhinitis   . Basal cell carcinoma    X 3: one on each leg and one on right shoulder  . Chronic renal insufficiency, stage II (mild)    GFR 60s  . Colon polyp 2008; 2013   Recall 5 yrs per pt report  . Generalized anxiety disorder   . Hyperlipidemia, mixed   . Hypertension    took meds x 82mo.  Came off meds and bp ok.  . Mitral valve prolapse   . Osteoarthritis    Fingers and hips.   Past Surgical History:  Procedure Laterality Date  . ABDOMINAL HYSTERECTOMY  1979   Done for DUB.  Ovaries are still in.  No further paps necessary.  . APPENDECTOMY  1979  . CARDIOVASCULAR STRESS TEST  2007   Normal stress echo (in [place])  . CATARACT EXTRACTION  07/2013  . COLONOSCOPY  12/2011   Recall 5 yrs   Family History  Problem Relation Age of Onset  . Breast cancer Mother     dx'd age 82  . Heart attack Mother   . Heart disease Mother   . Stroke Father   . Hypertension Father   . Cancer Father   .  Hypertension Sister   . Cancer Brother   . Hypertension Brother   . Heart attack Brother   . Pancreatitis Brother   . Hypertension Sister   . Myasthenia gravis Sister    Allergies as of 11/13/2016   No Known Allergies     Medication List       Accurate as of 11/13/16  1:31 PM. Always use your most recent med list.          CALCIUM 1200 PO Take 1,200 mg by mouth.   citalopram 20 MG tablet Commonly known as:  CELEXA TAKE 1 TABLET BY MOUTH EVERY DAY   Fish Oil 1200 MG Caps Take 1,200 mg by mouth.   simvastatin 20 MG tablet Commonly known as:  ZOCOR TAKE 1 TABLET BY MOUTH EVERY DAY   zoster vaccine live (PF) 19400 UNT/0.65ML injection Commonly known as:  ZOSTAVAX Inject 19,400 Units into the skin once.       No results found for this or any previous visit (from the past 24 hour(s)). No results found.   ROS: Negative, with the exception of above mentioned in HPI   Objective:  BP (!) 155/83 (BP Location: Right Arm, Patient Position: Sitting, Cuff Size: Normal)   Pulse (!) 116  Temp 98.7 F (37.1 C)   Resp 20   Wt 148 lb 8 oz (67.4 kg)   SpO2 98%   BMI 27.16 kg/m  Body mass index is 27.16 kg/m. Gen: Afebrile. No acute distress. Nontoxic in appearance, well developed, well nourished. Pleasant caucasian female.  HENT: AT. [place]. Bilateral TM visualized bilateral air fluid levle. MMM, no oral lesions. Bilateral nares with erythema, drainage, swelling. Throat without erythema or exudates. Cough, hoarseness present. No TTP sinus.  Eyes:Pupils Equal Round Reactive to light, Extraocular movements intact,  Conjunctiva without redness, discharge or icterus. Neck/lymp/endocrine: Supple,no lymphadenopathy CV: RRR  Chest: CTAB, no wheeze or crackles. Good air movement, normal resp effort.  Abd: Soft. NTND. BS present.    Assessment/Plan: Kathy Richardson is a 71 y.o. female present for acute OV for  Acute maxillary sinusitis, recurrence not specified Rest, hydrate.    Mucinex, flonase use Augmentin prescribed every 12 hours for 10 days.  F/U PRN, or if worsening condition.   electronically signed by:  Howard Pouch, DO  [place]

## 2016-12-13 DIAGNOSIS — M5136 Other intervertebral disc degeneration, lumbar region: Secondary | ICD-10-CM | POA: Diagnosis not present

## 2016-12-13 DIAGNOSIS — M9903 Segmental and somatic dysfunction of lumbar region: Secondary | ICD-10-CM | POA: Diagnosis not present

## 2016-12-13 DIAGNOSIS — M9902 Segmental and somatic dysfunction of thoracic region: Secondary | ICD-10-CM | POA: Diagnosis not present

## 2017-02-03 ENCOUNTER — Encounter: Payer: Self-pay | Admitting: Family Medicine

## 2017-02-03 ENCOUNTER — Ambulatory Visit (INDEPENDENT_AMBULATORY_CARE_PROVIDER_SITE_OTHER): Payer: Medicare Other | Admitting: Family Medicine

## 2017-02-03 VITALS — BP 146/80 | HR 65 | Temp 98.9°F | Resp 18 | Ht 62.0 in | Wt 147.4 lb

## 2017-02-03 DIAGNOSIS — Z0000 Encounter for general adult medical examination without abnormal findings: Secondary | ICD-10-CM

## 2017-02-03 DIAGNOSIS — Z1211 Encounter for screening for malignant neoplasm of colon: Secondary | ICD-10-CM | POA: Diagnosis not present

## 2017-02-03 DIAGNOSIS — E7800 Pure hypercholesterolemia, unspecified: Secondary | ICD-10-CM

## 2017-02-03 DIAGNOSIS — J301 Allergic rhinitis due to pollen: Secondary | ICD-10-CM

## 2017-02-03 DIAGNOSIS — J0100 Acute maxillary sinusitis, unspecified: Secondary | ICD-10-CM | POA: Diagnosis not present

## 2017-02-03 DIAGNOSIS — N182 Chronic kidney disease, stage 2 (mild): Secondary | ICD-10-CM

## 2017-02-03 LAB — COMPREHENSIVE METABOLIC PANEL
ALBUMIN: 4.3 g/dL (ref 3.5–5.2)
ALK PHOS: 77 U/L (ref 39–117)
ALT: 20 U/L (ref 0–35)
AST: 20 U/L (ref 0–37)
BILIRUBIN TOTAL: 0.7 mg/dL (ref 0.2–1.2)
BUN: 11 mg/dL (ref 6–23)
CO2: 29 mEq/L (ref 19–32)
CREATININE: 0.88 mg/dL (ref 0.40–1.20)
Calcium: 9.5 mg/dL (ref 8.4–10.5)
Chloride: 103 mEq/L (ref 96–112)
GFR: 67.74 mL/min (ref >60.00)
Glucose, Bld: 93 mg/dL (ref 70–99)
Potassium: 4.6 mEq/L (ref 3.5–5.1)
SODIUM: 139 meq/L (ref 135–145)
TOTAL PROTEIN: 6.6 g/dL (ref 6.0–8.3)

## 2017-02-03 LAB — LIPID PANEL
Cholesterol: 171 mg/dL (ref 0–200)
HDL: 37.8 mg/dL — ABNORMAL LOW (ref >39.00)
LDL Cholesterol: 108 mg/dL — ABNORMAL HIGH (ref 0–99)
NonHDL: 133.42
TRIGLYCERIDES: 125 mg/dL (ref 0.0–149.0)
Total CHOL/HDL Ratio: 5
VLDL: 25 mg/dL (ref 0.0–40.0)

## 2017-02-03 MED ORDER — AMOXICILLIN-POT CLAVULANATE 875-125 MG PO TABS: 1.0000 | ORAL_TABLET | Freq: Two times a day (BID) | ORAL | 0 refills | Status: DC

## 2017-02-03 MED ORDER — FLUTICASONE PROPIONATE 50 MCG/ACT NA SUSP: 2.0000 | Freq: Every day | NASAL | 6 refills | Status: DC

## 2017-02-03 NOTE — Progress Notes (Signed)
Pre visit review using our clinic review tool, if applicable. No additional management support is needed unless otherwise documented below in the visit note. 

## 2017-02-03 NOTE — Patient Instructions (Addendum)
Continue doing brain stimulating activities (puzzles, reading, adult coloring books, staying active) to keep memory sharp.   Continue to eat heart healthy diet (full of fruits, vegetables, whole grains, lean protein, water--limit salt, fat, and sugar intake) and increase physical activity as tolerated.  Bring a copy of your advance directives to your next office visit.  Schedule colonoscopy.   Fall Prevention in the Home Falls can cause injuries. They can happen to people of all ages. There are many things you can do to make your home safe and to help prevent falls. What can I do on the outside of my home?  Regularly fix the edges of walkways and driveways and fix any cracks.  Remove anything that might make you trip as you walk through a door, such as a raised step or threshold.  Trim any bushes or trees on the path to your home.  Use bright outdoor lighting.  Clear any walking paths of anything that might make someone trip, such as rocks or tools.  Regularly check to see if handrails are loose or broken. Make sure that both sides of any steps have handrails.  Any raised decks and porches should have guardrails on the edges.  Have any leaves, snow, or ice cleared regularly.  Use sand or salt on walking paths during winter.  Clean up any spills in your garage right away. This includes oil or grease spills. What can I do in the bathroom?  Use night lights.  Install grab bars by the toilet and in the tub and shower. Do not use towel bars as grab bars.  Use non-skid mats or decals in the tub or shower.  If you need to sit down in the shower, use a plastic, non-slip stool.  Keep the floor dry. Clean up any water that spills on the floor as soon as it happens.  Remove soap buildup in the tub or shower regularly.  Attach bath mats securely with double-sided non-slip rug tape.  Do not have throw rugs and other things on the floor that can make you trip. What can I do in the  bedroom?  Use night lights.  Make sure that you have a light by your bed that is easy to reach.  Do not use any sheets or blankets that are too big for your bed. They should not hang down onto the floor.  Have a firm chair that has side arms. You can use this for support while you get dressed.  Do not have throw rugs and other things on the floor that can make you trip. What can I do in the kitchen?  Clean up any spills right away.  Avoid walking on wet floors.  Keep items that you use a lot in easy-to-reach places.  If you need to reach something above you, use a strong step stool that has a grab bar.  Keep electrical cords out of the way.  Do not use floor polish or wax that makes floors slippery. If you must use wax, use non-skid floor wax.  Do not have throw rugs and other things on the floor that can make you trip. What can I do with my stairs?  Do not leave any items on the stairs.  Make sure that there are handrails on both sides of the stairs and use them. Fix handrails that are broken or loose. Make sure that handrails are as long as the stairways.  Check any carpeting to make sure that it is firmly attached  to the stairs. Fix any carpet that is loose or worn.  Avoid having throw rugs at the top or bottom of the stairs. If you do have throw rugs, attach them to the floor with carpet tape.  Make sure that you have a light switch at the top of the stairs and the bottom of the stairs. If you do not have them, ask someone to add them for you. What else can I do to help prevent falls?  Wear shoes that:  Do not have high heels.  Have rubber bottoms.  Are comfortable and fit you well.  Are closed at the toe. Do not wear sandals.  If you use a stepladder:  Make sure that it is fully opened. Do not climb a closed stepladder.  Make sure that both sides of the stepladder are locked into place.  Ask someone to hold it for you, if possible.  Clearly mark and make  sure that you can see:  Any grab bars or handrails.  First and last steps.  Where the edge of each step is.  Use tools that help you move around (mobility aids) if they are needed. These include:  Canes.  Walkers.  Scooters.  Crutches.  Turn on the lights when you go into a dark area. Replace any light bulbs as soon as they burn out.  Set up your furniture so you have a clear path. Avoid moving your furniture around.  If any of your floors are uneven, fix them.  If there are any pets around you, be aware of where they are.  Review your medicines with your doctor. Some medicines can make you feel dizzy. This can increase your chance of falling. Ask your doctor what other things that you can do to help prevent falls. This information is not intended to replace advice given to you by your health care provider. Make sure you discuss any questions you have with your health care provider. Document Released: 08/24/2009 Document Revised: 04/04/2016 Document Reviewed: 12/02/2014 Elsevier Interactive Patient Education  2017 [place] Maintenance, Female Adopting a healthy lifestyle and getting preventive care can go a long way to promote health and wellness. Talk with your health care provider about what schedule of regular examinations is right for you. This is a good chance for you to check in with your provider about disease prevention and staying healthy. In between checkups, there are plenty of things you can do on your own. Experts have done a lot of research about which lifestyle changes and preventive measures are most likely to keep you healthy. Ask your health care provider for more information. Weight and diet Eat a healthy diet  Be sure to include plenty of vegetables, fruits, low-fat dairy products, and lean protein.  Do not eat a lot of foods high in solid fats, added sugars, or salt.  Get regular exercise. This is one of the most important things you can do  for your health.  Most adults should exercise for at least 150 minutes each week. The exercise should increase your heart rate and make you sweat (moderate-intensity exercise).  Most adults should also do strengthening exercises at least twice a week. This is in addition to the moderate-intensity exercise. Maintain a healthy weight  Body mass index (BMI) is a measurement that can be used to identify possible weight problems. It estimates body fat based on height and weight. Your health care provider can help determine your BMI and help you achieve or maintain  a healthy weight.  For females 59 years of age and older:  A BMI below 18.5 is considered underweight.  A BMI of 18.5 to 24.9 is normal.  A BMI of 25 to 29.9 is considered overweight.  A BMI of 30 and above is considered obese. Watch levels of cholesterol and blood lipids  You should start having your blood tested for lipids and cholesterol at 69 years of age, then have this test every 5 years.  You may need to have your cholesterol levels checked more often if:  Your lipid or cholesterol levels are high.  You are older than 69 years of age.  You are at high risk for heart disease. Cancer screening Lung Cancer  Lung cancer screening is recommended for adults 29-67 years old who are at high risk for lung cancer because of a history of smoking.  A yearly low-dose CT scan of the lungs is recommended for people who:  Currently smoke.  Have quit within the past 15 years.  Have at least a 30-pack-year history of smoking. A pack year is smoking an average of one pack of cigarettes a day for 1 year.  Yearly screening should continue until it has been 15 years since you quit.  Yearly screening should stop if you develop a health problem that would prevent you from having lung cancer treatment. Breast Cancer  Practice breast self-awareness. This means understanding how your breasts normally appear and feel.  It also means  doing regular breast self-exams. Let your health care provider know about any changes, no matter how small.  If you are in your 20s or 30s, you should have a clinical breast exam (CBE) by a health care provider every 1-3 years as part of a regular health exam.  If you are 78 or older, have a CBE every year. Also consider having a breast X-ray (mammogram) every year.  If you have a family history of breast cancer, talk to your health care provider about genetic screening.  If you are at high risk for breast cancer, talk to your health care provider about having an MRI and a mammogram every year.  Breast cancer gene (BRCA) assessment is recommended for women who have family members with BRCA-related cancers. BRCA-related cancers include:  Breast.  Ovarian.  Tubal.  Peritoneal cancers.  Results of the assessment will determine the need for genetic counseling and BRCA1 and BRCA2 testing. Cervical Cancer  Your health care provider may recommend that you be screened regularly for cancer of the pelvic organs (ovaries, uterus, and vagina). This screening involves a pelvic examination, including checking for microscopic changes to the surface of your cervix (Pap test). You may be encouraged to have this screening done every 3 years, beginning at age 22.  For women ages 44-65, health care providers may recommend pelvic exams and Pap testing every 3 years, or they may recommend the Pap and pelvic exam, combined with testing for human papilloma virus (HPV), every 5 years. Some types of HPV increase your risk of cervical cancer. Testing for HPV may also be done on women of any age with unclear Pap test results.  Other health care providers may not recommend any screening for nonpregnant women who are considered low risk for pelvic cancer and who do not have symptoms. Ask your health care provider if a screening pelvic exam is right for you.  If you have had past treatment for cervical cancer or a  condition that could lead to cancer, you need  Pap tests and screening for cancer for at least 20 years after your treatment. If Pap tests have been discontinued, your risk factors (such as having a new sexual partner) need to be reassessed to determine if screening should resume. Some women have medical problems that increase the chance of getting cervical cancer. In these cases, your health care provider may recommend more frequent screening and Pap tests. Colorectal Cancer  This type of cancer can be detected and often prevented.  Routine colorectal cancer screening usually begins at 69 years of age and continues through 69 years of age.  Your health care provider may recommend screening at an earlier age if you have risk factors for colon cancer.  Your health care provider may also recommend using home test kits to check for hidden blood in the stool.  A small camera at the end of a tube can be used to examine your colon directly (sigmoidoscopy or colonoscopy). This is done to check for the earliest forms of colorectal cancer.  Routine screening usually begins at age 41.  Direct examination of the colon should be repeated every 5-10 years through 69 years of age. However, you may need to be screened more often if early forms of precancerous polyps or small growths are found. Skin Cancer  Check your skin from head to toe regularly.  Tell your health care provider about any new moles or changes in moles, especially if there is a change in a mole's shape or color.  Also tell your health care provider if you have a mole that is larger than the size of a pencil eraser.  Always use sunscreen. Apply sunscreen liberally and repeatedly throughout the day.  Protect yourself by wearing long sleeves, pants, a wide-brimmed hat, and sunglasses whenever you are outside. Heart disease, diabetes, and high blood pressure  High blood pressure causes heart disease and increases the risk of stroke. High  blood pressure is more likely to develop in:  People who have blood pressure in the high end of the normal range (130-139/85-89 mm Hg).  People who are overweight or obese.  People who are African American.  If you are 45-3 years of age, have your blood pressure checked every 3-5 years. If you are 38 years of age or older, have your blood pressure checked every year. You should have your blood pressure measured twice-once when you are at a hospital or clinic, and once when you are not at a hospital or clinic. Record the average of the two measurements. To check your blood pressure when you are not at a hospital or clinic, you can use:  An automated blood pressure machine at a pharmacy.  A home blood pressure monitor.  If you are between 1 years and 6 years old, ask your health care provider if you should take aspirin to prevent strokes.  Have regular diabetes screenings. This involves taking a blood sample to check your fasting blood sugar level.  If you are at a normal weight and have a low risk for diabetes, have this test once every three years after 69 years of age.  If you are overweight and have a high risk for diabetes, consider being tested at a younger age or more often. Preventing infection Hepatitis B  If you have a higher risk for hepatitis B, you should be screened for this virus. You are considered at high risk for hepatitis B if:  You were born in a country where hepatitis B is common. Ask your  health care provider which countries are considered high risk.  Your parents were born in a high-risk country, and you have not been immunized against hepatitis B (hepatitis B vaccine).  You have HIV or AIDS.  You use needles to inject street drugs.  You live with someone who has hepatitis B.  You have had sex with someone who has hepatitis B.  You get hemodialysis treatment.  You take certain medicines for conditions, including cancer, organ transplantation, and  autoimmune conditions. Hepatitis C  Blood testing is recommended for:  Everyone born from [birth year] through 1963.  Anyone with known risk factors for hepatitis C. Sexually transmitted infections (STIs)  You should be screened for sexually transmitted infections (STIs) including gonorrhea and chlamydia if:  You are sexually active and are younger than 69 years of age.  You are older than 69 years of age and your health care provider tells you that you are at risk for this type of infection.  Your sexual activity has changed since you were last screened and you are at an increased risk for chlamydia or gonorrhea. Ask your health care provider if you are at risk.  If you do not have HIV, but are at risk, it may be recommended that you take a prescription medicine daily to prevent HIV infection. This is called pre-exposure prophylaxis (PrEP). You are considered at risk if:  You are sexually active and do not regularly use condoms or know the HIV status of your partner(s).  You take drugs by injection.  You are sexually active with a partner who has HIV. Talk with your health care provider about whether you are at high risk of being infected with HIV. If you choose to begin PrEP, you should first be tested for HIV. You should then be tested every 3 months for as long as you are taking PrEP. Pregnancy  If you are premenopausal and you may become pregnant, ask your health care provider about preconception counseling.  If you may become pregnant, take 400 to 800 micrograms (mcg) of folic acid every day.  If you want to prevent pregnancy, talk to your health care provider about birth control (contraception). Osteoporosis and menopause  Osteoporosis is a disease in which the bones lose minerals and strength with aging. This can result in serious bone fractures. Your risk for osteoporosis can be identified using a bone density scan.  If you are 41 years of age or older, or if you are at risk for  osteoporosis and fractures, ask your health care provider if you should be screened.  Ask your health care provider whether you should take a calcium or vitamin D supplement to lower your risk for osteoporosis.  Menopause may have certain physical symptoms and risks.  Hormone replacement therapy may reduce some of these symptoms and risks. Talk to your health care provider about whether hormone replacement therapy is right for you. Follow these instructions at home:  Schedule regular health, dental, and eye exams.  Stay current with your immunizations.  Do not use any tobacco products including cigarettes, chewing tobacco, or electronic cigarettes.  If you are pregnant, do not drink alcohol.  If you are breastfeeding, limit how much and how often you drink alcohol.  Limit alcohol intake to no more than 1 drink per day for nonpregnant women. One drink equals 12 ounces of beer, 5 ounces of wine, or 1 ounces of hard liquor.  Do not use street drugs.  Do not share needles.  Ask your  health care provider for help if you need support or information about quitting drugs.  Tell your health care provider if you often feel depressed.  Tell your health care provider if you have ever been abused or do not feel safe at home. This information is not intended to replace advice given to you by your health care provider. Make sure you discuss any questions you have with your health care provider. Document Released: 05/13/2011 Document Revised: 04/04/2016 Document Reviewed: 08/01/2015 Elsevier Interactive Patient Education  2017 [employer].

## 2017-02-03 NOTE — Progress Notes (Signed)
AWV reviewed and agree.  Signed:  Crissie Sickles, MD           02/03/2017

## 2017-02-03 NOTE — Progress Notes (Signed)
OFFICE VISIT  02/03/2017   CC:  Chief Complaint  Patient presents with  . Medicare Wellness     HPI:    Patient is a 71 y.o.  female who presents for f/u hyperlipidemia, CRI stage II (GFR 60s). I last saw her for her medicare AWV 01/2016.  At that time she was tolerating statin well.  At that time all labs were stable and I recommended no changes.  She has had resp sx's the last 3d.  Lots of nasal mucous, cough began not long after, felt subjective fever, had a piercing pain R ear.  Started taking some augmentin 2 days ago b/c the mucous got thick/green/yellow. Was dealing with lots of allergy sx's recently.  Pain in peri-orbital region.  No pain in face or upper teeth. Burning/scratchy throat.  Taking delsym.  Taking simva daily, no side effects.  Taking citalopram 1/2 20mg  tab daily b/c she couldn't tolerate 20mg  qd dosing.   Past Medical History:  Diagnosis Date  . Allergic rhinitis   . Basal cell carcinoma    X 3: one on each leg and one on right shoulder  . Chronic renal insufficiency, stage II (mild)    GFR 60s  . Colon polyp 2008; 2013   Recall 5 yrs per pt report  . Generalized anxiety disorder   . Hyperlipidemia, mixed   . Hypertension    took meds x 68mo.  Came off meds and bp ok.  . Mitral valve prolapse   . Osteoarthritis    Fingers and hips.    Past Surgical History:  Procedure Laterality Date  . ABDOMINAL HYSTERECTOMY  1979   Done for DUB.  Ovaries are still in.  No further paps necessary.  . APPENDECTOMY  1979  . CARDIOVASCULAR STRESS TEST  2007   Normal stress echo (in [place])  . CATARACT EXTRACTION  07/2013  . COLONOSCOPY  12/2011   Recall 5 yrs    Outpatient Medications Prior to Visit  Medication Sig Dispense Refill  . Calcium Carbonate-Vit D-Min (CALCIUM 1200 PO) Take 1,200 mg by mouth.    . citalopram (CELEXA) 20 MG tablet TAKE 1 TABLET BY MOUTH EVERY DAY 30 tablet 6  . simvastatin (ZOCOR) 20 MG tablet TAKE 1 TABLET BY MOUTH EVERY DAY 30  tablet 11  . amoxicillin-clavulanate (AUGMENTIN) 875-125 MG tablet Take 1 tablet by mouth 2 (two) times daily. 20 tablet 0  . zoster vaccine live, PF, (ZOSTAVAX) 63875 UNT/0.65ML injection Inject 19,400 Units into the skin once. (Patient not taking: Reported on 02/03/2017) 1 each 0  . Omega-3 Fatty Acids (FISH OIL) 1200 MG CAPS Take 1,200 mg by mouth.     No facility-administered medications prior to visit.     No Known Allergies  ROS As per HPI  PE: Blood pressure (!) 146/80, pulse 65, temperature 98.9 F (37.2 C), resp. rate 18, height 5\' 2"  (1.575 m), weight 147 lb 6.4 oz (66.9 kg), SpO2 93 %. VS: noted--normal. Gen: alert, NAD, NONTOXIC APPEARING. HEENT: eyes without injection, drainage, or swelling.  Ears: EACs clear, TMs with normal light reflex and landmarks.  Nose: Clear rhinorrhea, with some dried, crusty exudate adherent to mildly injected mucosa.  No purulent d/c.  No paranasal sinus TTP.  No facial swelling.  Throat and mouth without focal lesion.  No pharyngial swelling, erythema, or exudate.   Neck: supple, no LAD.   LUNGS: CTA bilat, nonlabored resps.   CV: RRR, no m/r/g. EXT: no c/c/e SKIN: no rash  LABS:  Lab Results  Component Value Date   CHOL 180 02/02/2016   HDL 41.30 02/02/2016   LDLCALC 116 (H) 02/02/2016   TRIG 112.0 02/02/2016   CHOLHDL 4 02/02/2016     Chemistry      Component Value Date/Time   NA 140 02/02/2016 1048   K 4.3 02/02/2016 1048   CL 105 02/02/2016 1048   CO2 29 02/02/2016 1048   BUN 17 02/02/2016 1048   CREATININE 0.89 02/02/2016 1048      Component Value Date/Time   CALCIUM 9.5 02/02/2016 1048   ALKPHOS 72 02/02/2016 1048   AST 17 02/02/2016 1048   ALT 16 02/02/2016 1048   BILITOT 0.7 02/02/2016 1048       IMPRESSION AND PLAN:  No problem-specific Assessment & Plan notes found for this encounter.  1) Hyperlipidemia: tolerating statin.  Repeat FLP and CMET today.  2) CRI stage II (GFR 60s): repeat lytes/cr today.  3)   Allergic rhinitis with superimposed acute sinusitis, possibly bacterial. Will rx another 7d of augmentin--this will complete a 10d course. Start flonase and saline nasal sprays. Continue delsym for cough.  An After Visit Summary was printed and given to the patient.  FOLLOW UP: Return in about 1 year (around 02/03/2018) for routine chronic illness f/u + medicare AWV.  Signed:  Crissie Sickles, MD           02/03/2017

## 2017-02-03 NOTE — Progress Notes (Signed)
Subjective:   Kathy Richardson is a 71 y.o. female who presents for Medicare Annual (Subsequent) preventive examination.  Review of Systems:  No ROS.  Medicare Wellness Visit.  Cardiac Risk Factors include: advanced age (>89men, >47 women);dyslipidemia;family history of premature cardiovascular disease   Sleep patterns: Sleeps about 7-8 hours.  Home Safety/Smoke Alarms: Smoke detectors and security in place.   Living environment; residence and Firearm Safety: Lives with husband in 2 story home, rail in place. Feels safe in home. No firearms.  Seat Belt Safety/Bike Helmet: Wears seat belt.    Counseling:   Eye Exam-Last exam 02/2016, scheduled 02/17/17. Yearly by Dr. Melodye Ped 08/2016, scheduled for 02/05/17. Every 6 months by [facility].  Female:   Pap- Followed by GYN. Every 2 years. Kathy Richardson.    Mammo-11/16/2015, negative. Scheduled for 02/21/17 Dexa scan-12/02/2008, Osteopenia.  Scheduled for 02/21/2017 [facility]-Colonoscopy 12/16/2011, normal. H/O polyps. Recall 5 years. Referral ordered.       Objective:     Vitals: BP (!) 146/80 (BP Location: Right Arm, Patient Position: Sitting, Cuff Size: Normal)   Pulse 65   Temp 98.9 F (37.2 C)   Resp 18   Ht 5\' 2"  (1.575 m)   Wt 147 lb 6.4 oz (66.9 kg)   SpO2 93%   BMI 26.96 kg/m   Body mass index is 26.96 kg/m.   Tobacco History  Smoking Status  . Never Smoker  Smokeless Tobacco  . Never Used     Counseling given: No   Past Medical History:  Diagnosis Date  . Allergic rhinitis   . Basal cell carcinoma    X 3: one on each leg and one on right shoulder  . Chronic renal insufficiency, stage II (mild)    GFR 60s  . Colon polyp 2008; 2013   Recall 5 yrs per pt report  . Generalized anxiety disorder   . Hyperlipidemia, mixed   . Hypertension    took meds x 29mo.  Came off meds and bp ok.  . Mitral valve prolapse   . Osteoarthritis    Fingers and hips.   Past Surgical History:  Procedure Laterality  Date  . ABDOMINAL HYSTERECTOMY  1979   Done for DUB.  Ovaries are still in.  No further paps necessary.  . APPENDECTOMY  1979  . CARDIOVASCULAR STRESS TEST  2007   Normal stress echo (in [place])  . CATARACT EXTRACTION  07/2013  . COLONOSCOPY  12/2011   Recall 5 yrs   Family History  Problem Relation Age of Onset  . Breast cancer Mother     dx'd age 70  . Heart attack Mother   . Heart disease Mother   . Stroke Father   . Hypertension Father   . Cancer Father   . Hypertension Sister   . Cancer Brother   . Hypertension Brother   . Heart attack Brother   . Pancreatitis Brother   . Hypertension Sister   . Myasthenia gravis Sister    History  Sexual Activity  . Sexual activity: Not on file    Outpatient Encounter Prescriptions as of 02/03/2017  Medication Sig  . amoxicillin-clavulanate (AUGMENTIN) 875-125 MG tablet Take 1 tablet by mouth 2 (two) times daily.  . Calcium Carbonate-Vit D-Min (CALCIUM 1200 PO) Take 1,200 mg by mouth.  . citalopram (CELEXA) 20 MG tablet TAKE 1 TABLET BY MOUTH EVERY DAY  . Dextromethorphan Polistirex (DELSYM PO) Take by mouth.  . simvastatin (ZOCOR) 20 MG tablet TAKE 1 TABLET BY  MOUTH EVERY DAY  . zoster vaccine live, PF, (ZOSTAVAX) 62952 UNT/0.65ML injection Inject 19,400 Units into the skin once. (Patient not taking: Reported on 02/03/2017)  . [DISCONTINUED] Omega-3 Fatty Acids (FISH OIL) 1200 MG CAPS Take 1,200 mg by mouth.   No facility-administered encounter medications on file as of 02/03/2017.     Activities of Daily Living In your present state of health, do you have any difficulty performing the following activities: 02/03/2017  Hearing? N  Vision? N  Difficulty concentrating or making decisions? N  Walking or climbing stairs? N  Dressing or bathing? N  Doing errands, shopping? N  Preparing Food and eating ? N  Using the Toilet? N  In the past six months, have you accidently leaked urine? N  Do you have problems with loss of bowel  control? N  Managing your Medications? N  Managing your Finances? N  Housekeeping or managing your Housekeeping? N  Some recent data might be hidden    Patient Care Team: Tammi Sou, MD as PCP - General (Family Medicine) Devra Dopp, MD as Consulting Physician (Dermatology) Jola Schmidt, MD as Consulting Physician (Ophthalmology) [name], MD as Consulting Physician (Obstetrics and Gynecology)    Assessment:    Physical assessment deferred to PCP.  Exercise Activities and Dietary recommendations Current Exercise Habits: Home exercise routine (Golf, bowling), Type of exercise: walking, Time (Minutes): 60, Frequency (Times/Week): 4, Weekly Exercise (Minutes/Week): 240, Exercise limited by: None identified   Diet (meal preparation, eat out, water intake, caffeinated beverages, dairy products, fruits and vegetables): Drinks coffee, unsweet tea and water.   Breakfast: cereal with fruit, protein bar, bacon and eggs.  Lunch: salad, sandwich Dinner: lean protein, vegetables.   Encouraged to continue heart healthy diet and exercising.   Goals    . Weight (lb) < 137 lb (62.1 kg)          Would like to lose 10 pounds by increasing activity and reduce snacking.       Fall Risk Fall Risk  02/03/2017 02/02/2016 02/02/2016  Falls in the past year? No No No   Depression Screen PHQ 2/9 Scores 02/03/2017 02/02/2016 02/02/2016  PHQ - 2 Score 0 0 0     Cognitive Function       Ad8 score reviewed for issues:  Issues making decisions: no  Less interest in hobbies / activities: no  Repeats questions, stories (family complaining): no  Trouble using ordinary gadgets (microwave, computer, phone): no  Forgets the month or year: no  Mismanaging finances: no  Remembering appts: no  Daily problems with thinking and/or memory: no Ad8 score is=0     Immunization History  Administered Date(s) Administered  . Influenza-Unspecified 08/08/2014, 08/16/2016  .  Tdap 11/30/2014   Screening Tests Health Maintenance  Topic Date Due  . Hepatitis C Screening  28-Jun-1946  . PNA vac Low Risk Adult (1 of 2 - PCV13) 03/25/2013  . MAMMOGRAM  11/15/2017  . COLONOSCOPY  12/15/2021  . TETANUS/TDAP  11/30/2024  . INFLUENZA VACCINE  Addressed  . DEXA SCAN  Completed   Colonoscopy referral placed.  Mammogram and DEXA scan are scheduled for 02/2017.  Declines Hepatitis C Screening.      Plan:     Continue doing brain stimulating activities (puzzles, reading, adult coloring books, staying active) to keep memory sharp.   Continue to eat heart healthy diet (full of fruits, vegetables, whole grains, lean protein, water--limit salt, fat, and sugar intake) and increase physical activity as tolerated.  Bring a copy of your advance directives to your next office visit.  Schedule colonoscopy.  During the course of the visit the patient was educated and counseled about the following appropriate screening and preventive services:   Vaccines to include Pneumoccal, Influenza, Hepatitis B, Td, Zostavax, HCV  Cardiovascular Disease  Colorectal cancer screening  Bone density screening  Diabetes screening  Glaucoma screening  Mammography/PAP  Nutrition counseling   Patient Instructions (the written plan) was given to the patient.   Gerilyn Nestle, RN  02/03/2017

## 2017-02-09 HISTORY — PX: OTHER SURGICAL HISTORY: SHX169

## 2017-02-17 DIAGNOSIS — H524 Presbyopia: Secondary | ICD-10-CM | POA: Diagnosis not present

## 2017-02-17 DIAGNOSIS — H26493 Other secondary cataract, bilateral: Secondary | ICD-10-CM | POA: Diagnosis not present

## 2017-02-21 DIAGNOSIS — N941 Unspecified dyspareunia: Secondary | ICD-10-CM | POA: Diagnosis not present

## 2017-02-21 DIAGNOSIS — Z1231 Encounter for screening mammogram for malignant neoplasm of breast: Secondary | ICD-10-CM | POA: Diagnosis not present

## 2017-02-21 DIAGNOSIS — Z6826 Body mass index (BMI) 26.0-26.9, adult: Secondary | ICD-10-CM | POA: Diagnosis not present

## 2017-02-21 DIAGNOSIS — M8588 Other specified disorders of bone density and structure, other site: Secondary | ICD-10-CM | POA: Diagnosis not present

## 2017-02-21 DIAGNOSIS — N958 Other specified menopausal and perimenopausal disorders: Secondary | ICD-10-CM | POA: Diagnosis not present

## 2017-02-21 DIAGNOSIS — Z01419 Encounter for gynecological examination (general) (routine) without abnormal findings: Secondary | ICD-10-CM | POA: Diagnosis not present

## 2017-02-21 LAB — HM MAMMOGRAPHY

## 2017-02-21 LAB — HM DEXA SCAN

## 2017-03-07 ENCOUNTER — Encounter: Payer: Self-pay | Admitting: Family Medicine

## 2017-03-07 DIAGNOSIS — K635 Polyp of colon: Secondary | ICD-10-CM | POA: Diagnosis not present

## 2017-03-07 DIAGNOSIS — Z800 Family history of malignant neoplasm of digestive organs: Secondary | ICD-10-CM | POA: Diagnosis not present

## 2017-03-27 DIAGNOSIS — M859 Disorder of bone density and structure, unspecified: Secondary | ICD-10-CM | POA: Diagnosis not present

## 2017-04-21 DIAGNOSIS — Z800 Family history of malignant neoplasm of digestive organs: Secondary | ICD-10-CM | POA: Diagnosis not present

## 2017-04-21 DIAGNOSIS — K5140 Inflammatory polyps of colon without complications: Secondary | ICD-10-CM | POA: Diagnosis not present

## 2017-04-21 DIAGNOSIS — K621 Rectal polyp: Secondary | ICD-10-CM | POA: Diagnosis not present

## 2017-04-21 DIAGNOSIS — K648 Other hemorrhoids: Secondary | ICD-10-CM | POA: Diagnosis not present

## 2017-04-21 DIAGNOSIS — D126 Benign neoplasm of colon, unspecified: Secondary | ICD-10-CM | POA: Diagnosis not present

## 2017-04-21 DIAGNOSIS — Z86010 Personal history of colonic polyps: Secondary | ICD-10-CM | POA: Diagnosis not present

## 2017-04-21 HISTORY — PX: COLONOSCOPY: SHX174

## 2017-04-21 LAB — HM COLONOSCOPY

## 2017-04-24 DIAGNOSIS — K5140 Inflammatory polyps of colon without complications: Secondary | ICD-10-CM | POA: Diagnosis not present

## 2017-04-24 DIAGNOSIS — K621 Rectal polyp: Secondary | ICD-10-CM | POA: Diagnosis not present

## 2017-04-24 DIAGNOSIS — D126 Benign neoplasm of colon, unspecified: Secondary | ICD-10-CM | POA: Diagnosis not present

## 2017-05-05 ENCOUNTER — Encounter: Payer: Self-pay | Admitting: Family Medicine

## 2017-05-05 DIAGNOSIS — M9902 Segmental and somatic dysfunction of thoracic region: Secondary | ICD-10-CM | POA: Diagnosis not present

## 2017-05-05 DIAGNOSIS — M50322 Other cervical disc degeneration at C5-C6 level: Secondary | ICD-10-CM | POA: Diagnosis not present

## 2017-05-05 DIAGNOSIS — M9901 Segmental and somatic dysfunction of cervical region: Secondary | ICD-10-CM | POA: Diagnosis not present

## 2017-05-05 DIAGNOSIS — M5134 Other intervertebral disc degeneration, thoracic region: Secondary | ICD-10-CM | POA: Diagnosis not present

## 2017-05-06 ENCOUNTER — Encounter: Payer: Self-pay | Admitting: Family Medicine

## 2017-08-11 ENCOUNTER — Other Ambulatory Visit: Payer: Self-pay | Admitting: Family Medicine

## 2017-08-13 DIAGNOSIS — M9903 Segmental and somatic dysfunction of lumbar region: Secondary | ICD-10-CM | POA: Diagnosis not present

## 2017-08-13 DIAGNOSIS — M5136 Other intervertebral disc degeneration, lumbar region: Secondary | ICD-10-CM | POA: Diagnosis not present

## 2017-08-13 DIAGNOSIS — M9902 Segmental and somatic dysfunction of thoracic region: Secondary | ICD-10-CM | POA: Diagnosis not present

## 2017-08-13 DIAGNOSIS — M5134 Other intervertebral disc degeneration, thoracic region: Secondary | ICD-10-CM | POA: Diagnosis not present

## 2017-09-08 DIAGNOSIS — M9902 Segmental and somatic dysfunction of thoracic region: Secondary | ICD-10-CM | POA: Diagnosis not present

## 2017-09-08 DIAGNOSIS — M5134 Other intervertebral disc degeneration, thoracic region: Secondary | ICD-10-CM | POA: Diagnosis not present

## 2017-09-08 DIAGNOSIS — M9903 Segmental and somatic dysfunction of lumbar region: Secondary | ICD-10-CM | POA: Diagnosis not present

## 2017-09-08 DIAGNOSIS — M5136 Other intervertebral disc degeneration, lumbar region: Secondary | ICD-10-CM | POA: Diagnosis not present

## 2017-10-07 DIAGNOSIS — Z23 Encounter for immunization: Secondary | ICD-10-CM | POA: Diagnosis not present

## 2017-10-13 DIAGNOSIS — M5136 Other intervertebral disc degeneration, lumbar region: Secondary | ICD-10-CM | POA: Diagnosis not present

## 2017-10-13 DIAGNOSIS — M9903 Segmental and somatic dysfunction of lumbar region: Secondary | ICD-10-CM | POA: Diagnosis not present

## 2017-10-13 DIAGNOSIS — M5134 Other intervertebral disc degeneration, thoracic region: Secondary | ICD-10-CM | POA: Diagnosis not present

## 2017-10-13 DIAGNOSIS — M9902 Segmental and somatic dysfunction of thoracic region: Secondary | ICD-10-CM | POA: Diagnosis not present

## 2017-11-12 ENCOUNTER — Ambulatory Visit (INDEPENDENT_AMBULATORY_CARE_PROVIDER_SITE_OTHER): Payer: Medicare HMO | Admitting: Family Medicine

## 2017-11-12 ENCOUNTER — Encounter: Payer: Self-pay | Admitting: Family Medicine

## 2017-11-12 VITALS — BP 157/74 | HR 62 | Temp 98.5°F | Resp 16 | Ht 62.0 in | Wt 138.5 lb

## 2017-11-12 DIAGNOSIS — K5792 Diverticulitis of intestine, part unspecified, without perforation or abscess without bleeding: Secondary | ICD-10-CM | POA: Diagnosis not present

## 2017-11-12 DIAGNOSIS — R1031 Right lower quadrant pain: Secondary | ICD-10-CM

## 2017-11-12 MED ORDER — CIPROFLOXACIN HCL 500 MG PO TABS: ORAL_TABLET | ORAL | 0 refills | Status: DC

## 2017-11-12 MED ORDER — METRONIDAZOLE 500 MG PO TABS: 500.0000 mg | ORAL_TABLET | Freq: Three times a day (TID) | ORAL | 0 refills | Status: AC

## 2017-11-12 NOTE — Progress Notes (Signed)
OFFICE VISIT  11/12/2017   CC:  Chief Complaint  Patient presents with  . Abdominal Pain    RLQ x 5 days   HPI:    Patient is a 72 y.o. Caucasian female who presents for abdominal pain. Onset 5d ago lower abd pain.  Lower abd bloating, gassy, indigestion, lower abd cramping.  No radiation of pain into groin or back.  All across lower abd diffusely initially, then seems to be more RLQ and mid lower abd lately. Had not had a BM in 2d, took dulcolax and this helped but may have caused a brief severe RLQ pain. After BM from laxative she felt improved briefly.  She took augmentin yesterday to treat "some kind of infection in intestines". She is not improved.  NO alleviating factors.  Worse with moving around and tensing abd muscles.  Eating does not make it worse. Took ibup 2 tabs per day. No known sick contacts.  No fevers.  No n/v. No diarrhea.  No blood in stool. No dysuria, no urgency, no urinary frequency, no gross hematuria. Has been eating a lot of popcorn in the past.  No probs with similar pain in the past.   She is s/p appendectomy and TAH (without oophorectomy) in the remote past.  Past Medical History:  Diagnosis Date  . Allergic rhinitis   . Basal cell carcinoma    X 3: one on each leg and one on right shoulder  . Chronic renal insufficiency, stage II (mild)    GFR 60s  . Family history of colon cancer in father    Age 26.  Next colonoscopy due 04/2022.  Marland Kitchen Generalized anxiety disorder   . History of adenomatous polyp of colon 2008; 2013; 04/2017   2018--Recall 5 yrs (Dr. Megan Salon w/[facility])  . Hyperlipidemia, mixed   . Hypertension    took meds x 23mo.  Came off meds and bp ok.  . Mitral valve prolapse   . Osteoarthritis    Fingers and hips.    Past Surgical History:  Procedure Laterality Date  . ABDOMINAL HYSTERECTOMY  1979   Done for DUB.  Ovaries are still in.  No further paps necessary.  . APPENDECTOMY  1979  . CARDIOVASCULAR STRESS TEST  2007   Normal  stress echo (in [place])  . CATARACT EXTRACTION  07/2013  . COLONOSCOPY  12/2011   Recall 5 yrs  . COLONOSCOPY  04/21/2017   Tubular adenoma x 1. Repeat in 5 years (Dr. [facility])    Outpatient Medications Prior to Visit  Medication Sig Dispense Refill  . amoxicillin-clavulanate (AUGMENTIN) 875-125 MG tablet Take 1 tablet by mouth 2 (two) times daily. 14 tablet 0  . Calcium Carbonate-Vit D-Min (CALCIUM 1200 PO) Take 1,200 mg by mouth.    . citalopram (CELEXA) 20 MG tablet TAKE 1 TABLET BY MOUTH EVERY DAY 30 tablet 6  . fluticasone (FLONASE) 50 MCG/ACT nasal spray Place 2 sprays into both nostrils daily. 16 g 6  . simvastatin (ZOCOR) 20 MG tablet TAKE 1 TABLET BY MOUTH DAILY 30 tablet 5  . Dextromethorphan Polistirex (DELSYM PO) Take by mouth.    . zoster vaccine live, PF, (ZOSTAVAX) 84166 UNT/0.65ML injection Inject 19,400 Units into the skin once. (Patient not taking: Reported on 02/03/2017) 1 each 0   No facility-administered medications prior to visit.     No Known Allergies  ROS As per HPI, plus: Review of Systems  Constitutional: Negative for fatigue and fever.  HENT: Negative for congestion and  sore throat.   Eyes: Negative for visual disturbance.  Respiratory: Negative for cough.   Cardiovascular: Negative for chest pain.  Gastrointestinal: Negative for anal bleeding, blood in stool, diarrhea, nausea, rectal pain and vomiting.  Genitourinary: Negative for dysuria.  Musculoskeletal: Negative for back pain and joint swelling.  Skin: Negative for rash.  Neurological: Negative for weakness and headaches.  Hematological: Negative for adenopathy.     PE: Blood pressure (!) 157/74, pulse 62, temperature 98.5 F (36.9 C), temperature source Temporal, resp. rate 16, height 5\' 2"  (1.575 m), weight 138 lb 8 oz (62.8 kg), SpO2 98 %. Body mass index is 25.33 kg/m.  Pt examined with Helayne Seminole, CMA, as chaperone. Gen: Alert, well appearing.  Patient is oriented to  person, place, time, and situation. AFFECT: pleasant, lucid thought and speech. Neck - No masses or thyromegaly or limitation in range of motion CV: RRR, no m/r/g.   LUNGS: CTA bilat, nonlabored resps, good aeration in all lung fields. ABD: soft, ND, BS normal, no HSM or bruit.  No mass.  RLQ tenderness---significant but no guarding or rebound.  Some lower abd pain in midline as well.  LLQ w/out TTP.  Doing an abd crunch while I palpate her area of tenderness does not change her pain. SKIN: no pallor or jaundice.  LABS:    Chemistry      Component Value Date/Time   NA 139 02/03/2017 0958   K 4.6 02/03/2017 0958   CL 103 02/03/2017 0958   CO2 29 02/03/2017 0958   BUN 11 02/03/2017 0958   CREATININE 0.88 02/03/2017 0958      Component Value Date/Time   CALCIUM 9.5 02/03/2017 0958   ALKPHOS 77 02/03/2017 0958   AST 20 02/03/2017 0958   ALT 20 02/03/2017 0958   BILITOT 0.7 02/03/2017 0958  EXT: no clubbing, cyanosis, or edema.       IMPRESSION AND PLAN:  Lower abdominal pain, acute, RLQ >mid lower abd.   No sign of acute abdomen or SBO or IBD. Pt has no appendix.   Will check CBC and CMET, start treatment for working dx of acute diverticulitis--cipro 500 mg bid x 14d and flagyl 500mg  tid x 14d.  (Other DDx:  mesenteric adenitis, ovarian cyst/mass, less likely ruptured abd viscous, less likely ureterolithiasis). F/u for recheck in 7d, earlier if needed. If not improved or if worse at that time, will proceed with abd/pelvic CT with contrast.  An After Visit Summary was printed and given to the patient.  FOLLOW UP: Return in about 7 days (around 11/19/2017) for f/u RLQ abd pain.  Signed:  Crissie Sickles, MD           11/12/2017

## 2017-11-13 ENCOUNTER — Encounter: Payer: Self-pay | Admitting: *Deleted

## 2017-11-13 LAB — COMPREHENSIVE METABOLIC PANEL
AG RATIO: 1.6 (calc) (ref 1.0–2.5)
ALKALINE PHOSPHATASE (APISO): 74 U/L (ref 33–130)
ALT: 12 U/L (ref 6–29)
AST: 16 U/L (ref 10–35)
Albumin: 3.8 g/dL (ref 3.6–5.1)
BUN: 17 mg/dL (ref 7–25)
CHLORIDE: 103 mmol/L (ref 98–110)
CO2: 27 mmol/L (ref 20–32)
CREATININE: 0.81 mg/dL (ref 0.50–0.99)
Calcium: 9.2 mg/dL (ref 8.6–10.4)
GLOBULIN: 2.4 g/dL (ref 1.9–3.7)
GLUCOSE: 84 mg/dL (ref 65–99)
POTASSIUM: 4.2 mmol/L (ref 3.5–5.3)
Sodium: 138 mmol/L (ref 135–146)
Total Bilirubin: 0.5 mg/dL (ref 0.2–1.2)
Total Protein: 6.2 g/dL (ref 6.1–8.1)

## 2017-11-13 LAB — CBC WITH DIFFERENTIAL/PLATELET
BASOS PCT: 0.5 %
Basophils Absolute: 32 cells/uL (ref 0–200)
EOS ABS: 309 {cells}/uL (ref 15–500)
Eosinophils Relative: 4.9 %
HCT: 38 % (ref 35.0–45.0)
HEMOGLOBIN: 12.9 g/dL (ref 11.7–15.5)
Lymphs Abs: 1859 cells/uL (ref 850–3900)
MCH: 29.7 pg (ref 27.0–33.0)
MCHC: 33.9 g/dL (ref 32.0–36.0)
MCV: 87.4 fL (ref 80.0–100.0)
MPV: 9.6 fL (ref 7.5–12.5)
Monocytes Relative: 8.9 %
NEUTROS ABS: 3541 {cells}/uL (ref 1500–7800)
Neutrophils Relative %: 56.2 %
Platelets: 216 10*3/uL (ref 140–400)
RBC: 4.35 10*6/uL (ref 3.80–5.10)
RDW: 12 % (ref 11.0–15.0)
Total Lymphocyte: 29.5 %
WBC: 6.3 10*3/uL (ref 3.8–10.8)
WBCMIX: 561 {cells}/uL (ref 200–950)

## 2017-11-19 ENCOUNTER — Ambulatory Visit (INDEPENDENT_AMBULATORY_CARE_PROVIDER_SITE_OTHER): Payer: Medicare HMO | Admitting: Family Medicine

## 2017-11-19 ENCOUNTER — Encounter: Payer: Self-pay | Admitting: Family Medicine

## 2017-11-19 VITALS — BP 154/81 | HR 65 | Temp 98.5°F | Resp 16 | Ht 62.0 in | Wt 137.0 lb

## 2017-11-19 DIAGNOSIS — R110 Nausea: Secondary | ICD-10-CM

## 2017-11-19 DIAGNOSIS — T887XXA Unspecified adverse effect of drug or medicament, initial encounter: Secondary | ICD-10-CM

## 2017-11-19 DIAGNOSIS — K5792 Diverticulitis of intestine, part unspecified, without perforation or abscess without bleeding: Secondary | ICD-10-CM

## 2017-11-19 MED ORDER — PROMETHAZINE HCL 12.5 MG PO TABS: ORAL_TABLET | ORAL | 0 refills | Status: DC

## 2017-11-19 NOTE — Patient Instructions (Signed)
Take your antibiotics for 3 more days. Take the nausea medication I sent in to pharmacy---as needed.

## 2017-11-19 NOTE — Progress Notes (Signed)
OFFICE VISIT  11/19/2017   CC:  Chief Complaint  Patient presents with  . Follow-up    RLQ Abdominal Pain   HPI:    Patient is a 72 y.o. Caucasian female who presents for 1 week f/u abdominal pain. Although dx was not entirely clear, there were no red flags for worrisome intra-abdominal process, so I started empiric tx for acute diverticulitis with flagyl and cipro. CBC and BMET normal at that time.  Two days after her visit here last week her abd pain completely resolved. She has been compliant with abx. However, the meds are causing her nausea (w/out vomiting).  Eating and drinking fine. No diarrhea.  Has metallic taste in mouth a lot. No fevers.  Past Medical History:  Diagnosis Date  . Allergic rhinitis   . Basal cell carcinoma    X 3: one on each leg and one on right shoulder  . Chronic renal insufficiency, stage II (mild)    GFR 60s  . Family history of colon cancer in father    Age 11.  Next colonoscopy due 04/2022.  Marland Kitchen Generalized anxiety disorder   . History of adenomatous polyp of colon 2008; 2013; 04/2017   2018--Recall 5 yrs (Dr. Megan Salon w/[facility])  . Hyperlipidemia, mixed   . Hypertension    took meds x 71mo.  Came off meds and bp ok.  . Mitral valve prolapse   . Osteoarthritis    Fingers and hips.    Past Surgical History:  Procedure Laterality Date  . ABDOMINAL HYSTERECTOMY  1979   Done for DUB.  Ovaries are still in.  No further paps necessary.  . APPENDECTOMY  1979  . CARDIOVASCULAR STRESS TEST  2007   Normal stress echo (in [place])  . CATARACT EXTRACTION  07/2013  . COLONOSCOPY  12/2011   Recall 5 yrs  . COLONOSCOPY  04/21/2017   Tubular adenoma x 1. Repeat in 5 years (Dr. [facility])    Outpatient Medications Prior to Visit  Medication Sig Dispense Refill  . Calcium Carbonate-Vit D-Min (CALCIUM 1200 PO) Take 1,200 mg by mouth.    . ciprofloxacin (CIPRO) 500 MG tablet 1 tab po bid x 14d 28 tablet 0  . citalopram (CELEXA) 20 MG tablet TAKE 1  TABLET BY MOUTH EVERY DAY 30 tablet 6  . fluticasone (FLONASE) 50 MCG/ACT nasal spray Place 2 sprays into both nostrils daily. 16 g 6  . metroNIDAZOLE (FLAGYL) 500 MG tablet Take 1 tablet (500 mg total) by mouth 3 (three) times daily for 10 days. 42 tablet 0  . simvastatin (ZOCOR) 20 MG tablet TAKE 1 TABLET BY MOUTH DAILY 30 tablet 5  . amoxicillin-clavulanate (AUGMENTIN) 875-125 MG tablet Take 1 tablet by mouth 2 (two) times daily. (Patient not taking: Reported on 11/19/2017) 14 tablet 0   No facility-administered medications prior to visit.     No Known Allergies  ROS As per HPI  PE: Blood pressure (!) 154/81, pulse 65, temperature 98.5 F (36.9 C), temperature source Oral, resp. rate 16, height 5\' 2"  (1.575 m), weight 137 lb (62.1 kg), SpO2 99 %. Pt examined with Helayne Seminole, CMA, as chaperone. Mouth: no thrush. Abd: no distention or tenderness.  BS normal.  LABS:  Lab Results  Component Value Date   WBC 6.3 11/12/2017   HGB 12.9 11/12/2017   HCT 38.0 11/12/2017   MCV 87.4 11/12/2017   PLT 216 11/12/2017     Chemistry      Component Value Date/Time  NA 138 11/12/2017 1542   K 4.2 11/12/2017 1542   CL 103 11/12/2017 1542   CO2 27 11/12/2017 1542   BUN 17 11/12/2017 1542   CREATININE 0.81 11/12/2017 1542      Component Value Date/Time   CALCIUM 9.2 11/12/2017 1542   ALKPHOS 77 02/03/2017 0958   AST 16 11/12/2017 1542   ALT 12 11/12/2017 1542   BILITOT 0.5 11/12/2017 1542       IMPRESSION AND PLAN:  Acute diverticulitis, resolving appropriately. Significant nausea and metallic taste---side effects from metronid and cipro. Take abx for 10d course instead of 14d. Promethazine 12.5, 1-2 tabs q6h prn, #30, no RF.  An After Visit Summary was printed and given to the patient.  FOLLOW UP: Return for as needed.  Signed:  Crissie Sickles, MD           11/19/2017

## 2017-11-20 DIAGNOSIS — R69 Illness, unspecified: Secondary | ICD-10-CM | POA: Diagnosis not present

## 2018-01-28 DIAGNOSIS — M9903 Segmental and somatic dysfunction of lumbar region: Secondary | ICD-10-CM | POA: Diagnosis not present

## 2018-01-28 DIAGNOSIS — M50322 Other cervical disc degeneration at C5-C6 level: Secondary | ICD-10-CM | POA: Diagnosis not present

## 2018-01-28 DIAGNOSIS — M9901 Segmental and somatic dysfunction of cervical region: Secondary | ICD-10-CM | POA: Diagnosis not present

## 2018-01-28 DIAGNOSIS — M5136 Other intervertebral disc degeneration, lumbar region: Secondary | ICD-10-CM | POA: Diagnosis not present

## 2018-02-11 DIAGNOSIS — M5136 Other intervertebral disc degeneration, lumbar region: Secondary | ICD-10-CM | POA: Diagnosis not present

## 2018-02-11 DIAGNOSIS — M9901 Segmental and somatic dysfunction of cervical region: Secondary | ICD-10-CM | POA: Diagnosis not present

## 2018-02-11 DIAGNOSIS — M9903 Segmental and somatic dysfunction of lumbar region: Secondary | ICD-10-CM | POA: Diagnosis not present

## 2018-02-11 DIAGNOSIS — M50322 Other cervical disc degeneration at C5-C6 level: Secondary | ICD-10-CM | POA: Diagnosis not present

## 2018-02-16 NOTE — Progress Notes (Signed)
Subjective:   Kathy Richardson is a 72 y.o. female who presents for Medicare Annual (Subsequent) preventive examination.  Review of Systems:  No ROS.  Medicare Wellness Visit. Additional risk factors are reflected in the social history.  Cardiac Risk Factors include: advanced age (>4men, >79 women);dyslipidemia;hypertension;family history of premature cardiovascular disease   Sleep patterns: Sleeps 7 hours, feels rested.  Home Safety/Smoke Alarms: Feels safe in home. Smoke alarms in place.  Living environment; residence and Firearm Safety: Lives with husband in 2 story home, rail in place.  Seat Belt Safety/Bike Helmet: Wears seat belt.   Female:   Pap-Followed by GYN. Every 2 years. Kathy Richardson.     Mammo-02/21/17, pt reports normal. (GYN). Schedule 04/27/2018.  Dexa scan-02/21/17, pt reports osteopenia  (GYN) [facility]-Colonoscopy 04/21/2017, Recall 5 years.      Objective:     Vitals: BP (!) 142/82 (BP Location: Left Arm, Patient Position: Sitting, Cuff Size: Normal)   Pulse (!) 50   Ht 5\' 2"  (1.575 m)   Wt 137 lb 3.2 oz (62.2 kg)   SpO2 98%   BMI 25.09 kg/m   Body mass index is 25.09 kg/m.  Advanced Directives 02/17/2018 02/03/2017 06/11/2015  Does Patient Have a Medical Advance Directive? Yes Yes No  Type of Paramedic of [place];Living will [place];Living will -  Copy of [place] in Chart? No - copy requested No - copy requested -    Tobacco Social History   Tobacco Use  Smoking Status Never Smoker  Smokeless Tobacco Never Used     Counseling given: Not Answered    Past Medical History:  Diagnosis Date  . Allergic rhinitis   . Basal cell carcinoma    X 3: one on each leg and one on right shoulder  . Chronic renal insufficiency, stage II (mild)    GFR 60s  . Family history of colon cancer in father    Age 60.  Next colonoscopy due 04/2022.  Marland Kitchen Generalized anxiety disorder   . History of  adenomatous polyp of colon 2008; 2013; 04/2017   2018--Recall 5 yrs (Dr. Megan Salon w/[facility])  . Hyperlipidemia, mixed   . Hypertension    took meds x 41mo.  Came off meds and bp ok.  . Mitral valve prolapse   . Osteoarthritis    Fingers and hips.   Past Surgical History:  Procedure Laterality Date  . ABDOMINAL HYSTERECTOMY  1979   Done for DUB.  Ovaries are still in.  No further paps necessary.  . APPENDECTOMY  1979  . CARDIOVASCULAR STRESS TEST  2007   Normal stress echo (in [place])  . CATARACT EXTRACTION  07/2013  . COLONOSCOPY  12/2011   Recall 5 yrs  . COLONOSCOPY  04/21/2017   Tubular adenoma x 1. Repeat in 5 years (Dr. [facility])   Family History  Problem Relation Age of Onset  . Breast cancer Mother        dx'd age 36  . Heart attack Mother   . Heart disease Mother   . Stroke Father   . Hypertension Father   . Cancer Father   . Hypertension Sister   . Cancer Brother   . Hypertension Brother   . Heart attack Brother   . Pancreatitis Brother   . Hypertension Sister   . Myasthenia gravis Sister    Social History   Socioeconomic History  . Marital status: Married    Spouse name: Not on file  .  Number of children: Not on file  . Years of education: Not on file  . Highest education level: Not on file  Occupational History  . Not on file  Social Needs  . Financial resource strain: Not on file  . Food insecurity:    Worry: Not on file    Inability: Not on file  . Transportation needs:    Medical: Not on file    Non-medical: Not on file  Tobacco Use  . Smoking status: Never Smoker  . Smokeless tobacco: Never Used  Substance and Sexual Activity  . Alcohol use: Yes    Alcohol/week: 0.6 oz    Types: 1 Glasses of wine per week    Comment: 4-5 times weekly  . Drug use: No  . Sexual activity: Not on file  Lifestyle  . Physical activity:    Days per week: Not on file    Minutes per session: Not on file  . Stress: Not on file  Relationships  . Social  connections:    Talks on phone: Not on file    Gets together: Not on file    Attends religious service: Not on file    Active member of club or organization: Not on file    Attends meetings of clubs or organizations: Not on file    Relationship status: Not on file  Other Topics Concern  . Not on file  Social History Narrative   Married, 1 son, 1 grand-daughter.   Orig from [place], then [place] for 30 yrs, relocated back to [place].   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   NO tob.  Alc: 1 glass wine 4-5 times per week.    Outpatient Encounter Medications as of 02/17/2018  Medication Sig  . Calcium Carbonate-Vit D-Min (CALCIUM 1200 PO) Take 1,200 mg by mouth.  . citalopram (CELEXA) 20 MG tablet TAKE 1 TABLET BY MOUTH EVERY DAY (Patient taking differently: half tablet daily)  . fluticasone (FLONASE) 50 MCG/ACT nasal spray Place 2 sprays into both nostrils daily.  . simvastatin (ZOCOR) 20 MG tablet TAKE 1 TABLET BY MOUTH DAILY  . promethazine (PHENERGAN) 12.5 MG tablet 1-2 po q6h prn for nausea (Patient not taking: Reported on 02/17/2018)  . Zoster Vaccine Adjuvanted [facility]) injection Inject 0.5 mLs into the muscle once for 1 dose.  . [DISCONTINUED] ciprofloxacin (CIPRO) 500 MG tablet 1 tab po bid x 14d   No facility-administered encounter medications on file as of 02/17/2018.     Activities of Daily Living In your present state of health, do you have any difficulty performing the following activities: 02/17/2018  Hearing? N  Vision? N  Difficulty concentrating or making decisions? N  Walking or climbing stairs? N  Dressing or bathing? N  Doing errands, shopping? N  Preparing Food and eating ? N  Using the Toilet? N  In the past six months, have you accidently leaked urine? N  Do you have problems with loss of bowel control? N  Managing your Medications? N  Managing your Finances? N  Housekeeping or managing your Housekeeping? N  Some  recent data might be hidden    Patient Care Team: Tammi Sou, MD as PCP - General (Family Medicine) [place], Jennefer Bravo, MD as Consulting Physician (Dermatology) Jola Schmidt, MD as Consulting Physician (Ophthalmology) Kathy Pearson, MD as Consulting Physician (Obstetrics and Gynecology) Ronnette Juniper, MD as Consulting Physician (Gastroenterology) Marrion Coy, MD as Referring Physician (Chiropractic Medicine)  Assessment:   This is a routine wellness examination for Keena.  Exercise Activities and Dietary recommendations Current Exercise Habits: Home exercise routine, Type of exercise: walking;Other - see comments(10K steps/day; bowling 1/week), Frequency (Times/Week): 3, Exercise limited by: None identified   Diet (meal preparation, eat out, water intake, caffeinated beverages, dairy products, fruits and vegetables): Drinks water, unsweet tea and coffee  Breakfast: protein bar; bacon/egg; fruit/nuts and honey Lunch: fruit/pb; salad Dinner: lean protein and vegetables.      Goals    . Patient Stated     Maintain current health by staying active.     . Weight (lb) < 137 lb (62.1 kg)     Would like to lose 10 pounds by increasing activity and reduce snacking.        Fall Risk Fall Risk  02/17/2018 02/03/2017 02/02/2016 02/02/2016  Falls in the past year? No No No No    Depression Screen PHQ 2/9 Scores 02/17/2018 02/03/2017 02/02/2016 02/02/2016  PHQ - 2 Score 0 0 0 0     Cognitive Function MMSE - Mini Mental State Exam 02/17/2018  Orientation to time 5  Orientation to Place 5  Registration 3  Attention/ Calculation 5  Recall 3  Language- name 2 objects 2  Language- repeat 1  Language- follow 3 step command 3  Language- read & follow direction 1  Write a sentence 1  Copy design 1  Total score 30        Immunization History  Administered Date(s) Administered  . Influenza-Unspecified 08/08/2014, 08/16/2016  . Tdap 11/30/2014     Screening  Tests Health Maintenance  Topic Date Due  . Hepatitis C Screening  02/18/2019 (Originally 1946-02-11)  . PNA vac Low Risk Adult (1 of 2 - PCV13) 02/18/2019 (Originally 03/25/2013)  . INFLUENZA VACCINE  06/11/2018  . MAMMOGRAM  02/22/2019  . COLONOSCOPY  04/21/2022  . TETANUS/TDAP  11/30/2024  . DEXA SCAN  Completed        Plan:     Shingles vaccine at pharmacy.   Bring a copy of your living will and/or healthcare power of attorney to your next office visit.  Continue doing brain stimulating activities (puzzles, reading, adult coloring books, staying active) to keep memory sharp.   Continue to eat heart healthy diet (full of fruits, vegetables, whole grains, lean protein, water--limit salt, fat, and sugar intake) and increase physical activity as tolerated.  I have personally reviewed and noted the following in the patient's chart:   . Medical and social history . Use of alcohol, tobacco or illicit drugs  . Current medications and supplements . Functional ability and status . Nutritional status . Physical activity . Advanced directives . List of other physicians . Hospitalizations, surgeries, and ER visits in previous 12 months . Vitals . Screenings to include cognitive, depression, and falls . Referrals and appointments  In addition, I have reviewed and discussed with patient certain preventive protocols, quality metrics, and best practice recommendations. A written personalized care plan for preventive services as well as general preventive health recommendations were provided to patient.     Gerilyn Nestle, RN  02/17/2018   PCP Notes: -Advised to check BP at home occasionally, bring readings to next appt.  -Next appt with PCP 03/2018 (CPE)

## 2018-02-17 ENCOUNTER — Encounter: Payer: Self-pay | Admitting: Family Medicine

## 2018-02-17 ENCOUNTER — Ambulatory Visit (INDEPENDENT_AMBULATORY_CARE_PROVIDER_SITE_OTHER): Payer: Medicare HMO

## 2018-02-17 ENCOUNTER — Other Ambulatory Visit: Payer: Self-pay

## 2018-02-17 DIAGNOSIS — M50322 Other cervical disc degeneration at C5-C6 level: Secondary | ICD-10-CM | POA: Diagnosis not present

## 2018-02-17 DIAGNOSIS — Z0000 Encounter for general adult medical examination without abnormal findings: Secondary | ICD-10-CM | POA: Diagnosis not present

## 2018-02-17 DIAGNOSIS — M5136 Other intervertebral disc degeneration, lumbar region: Secondary | ICD-10-CM | POA: Diagnosis not present

## 2018-02-17 DIAGNOSIS — Z23 Encounter for immunization: Secondary | ICD-10-CM | POA: Diagnosis not present

## 2018-02-17 DIAGNOSIS — M9903 Segmental and somatic dysfunction of lumbar region: Secondary | ICD-10-CM | POA: Diagnosis not present

## 2018-02-17 DIAGNOSIS — M9901 Segmental and somatic dysfunction of cervical region: Secondary | ICD-10-CM | POA: Diagnosis not present

## 2018-02-17 MED ORDER — ZOSTER VAC RECOMB ADJUVANTED 50 MCG/0.5ML IM SUSR: 0.5000 mL | Freq: Once | INTRAMUSCULAR | 1 refills | Status: AC

## 2018-02-17 NOTE — Patient Instructions (Addendum)
Shingles vaccine at pharmacy.   Bring a copy of your living will and/or healthcare power of attorney to your next office visit.  Continue doing brain stimulating activities (puzzles, reading, adult coloring books, staying active) to keep memory sharp.   Continue to eat heart healthy diet (full of fruits, vegetables, whole grains, lean protein, water--limit salt, fat, and sugar intake) and increase physical activity as tolerated.   Health Maintenance, Female Adopting a healthy lifestyle and getting preventive care can go a long way to promote health and wellness. Talk with your health care provider about what schedule of regular examinations is right for you. This is a good chance for you to check in with your provider about disease prevention and staying healthy. In between checkups, there are plenty of things you can do on your own. Experts have done a lot of research about which lifestyle changes and preventive measures are most likely to keep you healthy. Ask your health care provider for more information. Weight and diet Eat a healthy diet  Be sure to include plenty of vegetables, fruits, low-fat dairy products, and lean protein.  Do not eat a lot of foods high in solid fats, added sugars, or salt.  Get regular exercise. This is one of the most important things you can do for your health. ? Most adults should exercise for at least 150 minutes each week. The exercise should increase your heart rate and make you sweat (moderate-intensity exercise). ? Most adults should also do strengthening exercises at least twice a week. This is in addition to the moderate-intensity exercise.  Maintain a healthy weight  Body mass index (BMI) is a measurement that can be used to identify possible weight problems. It estimates body fat based on height and weight. Your health care provider can help determine your BMI and help you achieve or maintain a healthy weight.  For females 68 years of age and  older: ? A BMI below 18.5 is considered underweight. ? A BMI of 18.5 to 24.9 is normal. ? A BMI of 25 to 29.9 is considered overweight. ? A BMI of 30 and above is considered obese.  Watch levels of cholesterol and blood lipids  You should start having your blood tested for lipids and cholesterol at 70 years of age, then have this test every 5 years.  You may need to have your cholesterol levels checked more often if: ? Your lipid or cholesterol levels are high. ? You are older than 70 years of age. ? You are at high risk for heart disease.  Cancer screening Lung Cancer  Lung cancer screening is recommended for adults 38-67 years old who are at high risk for lung cancer because of a history of smoking.  A yearly low-dose CT scan of the lungs is recommended for people who: ? Currently smoke. ? Have quit within the past 15 years. ? Have at least a 30-pack-year history of smoking. A pack year is smoking an average of one pack of cigarettes a day for 1 year.  Yearly screening should continue until it has been 15 years since you quit.  Yearly screening should stop if you develop a health problem that would prevent you from having lung cancer treatment.  Breast Cancer  Practice breast self-awareness. This means understanding how your breasts normally appear and feel.  It also means doing regular breast self-exams. Let your health care provider know about any changes, no matter how small.  If you are in your 20s or 30s,  you should have a clinical breast exam (CBE) by a health care provider every 1-3 years as part of a regular health exam.  If you are 63 or older, have a CBE every year. Also consider having a breast X-ray (mammogram) every year.  If you have a family history of breast cancer, talk to your health care provider about genetic screening.  If you are at high risk for breast cancer, talk to your health care provider about having an MRI and a mammogram every year.  Breast  cancer gene (BRCA) assessment is recommended for women who have family members with BRCA-related cancers. BRCA-related cancers include: ? Breast. ? Ovarian. ? Tubal. ? Peritoneal cancers.  Results of the assessment will determine the need for genetic counseling and BRCA1 and BRCA2 testing.  Cervical Cancer Your health care provider may recommend that you be screened regularly for cancer of the pelvic organs (ovaries, uterus, and vagina). This screening involves a pelvic examination, including checking for microscopic changes to the surface of your cervix (Pap test). You may be encouraged to have this screening done every 3 years, beginning at age 74.  For women ages 59-65, health care providers may recommend pelvic exams and Pap testing every 3 years, or they may recommend the Pap and pelvic exam, combined with testing for human papilloma virus (HPV), every 5 years. Some types of HPV increase your risk of cervical cancer. Testing for HPV may also be done on women of any age with unclear Pap test results.  Other health care providers may not recommend any screening for nonpregnant women who are considered low risk for pelvic cancer and who do not have symptoms. Ask your health care provider if a screening pelvic exam is right for you.  If you have had past treatment for cervical cancer or a condition that could lead to cancer, you need Pap tests and screening for cancer for at least 20 years after your treatment. If Pap tests have been discontinued, your risk factors (such as having a new sexual partner) need to be reassessed to determine if screening should resume. Some women have medical problems that increase the chance of getting cervical cancer. In these cases, your health care provider may recommend more frequent screening and Pap tests.  Colorectal Cancer  This type of cancer can be detected and often prevented.  Routine colorectal cancer screening usually begins at 70 years of age and  continues through 70 years of age.  Your health care provider may recommend screening at an earlier age if you have risk factors for colon cancer.  Your health care provider may also recommend using home test kits to check for hidden blood in the stool.  A small camera at the end of a tube can be used to examine your colon directly (sigmoidoscopy or colonoscopy). This is done to check for the earliest forms of colorectal cancer.  Routine screening usually begins at age 28.  Direct examination of the colon should be repeated every 5-10 years through 70 years of age. However, you may need to be screened more often if early forms of precancerous polyps or small growths are found.  Skin Cancer  Check your skin from head to toe regularly.  Tell your health care provider about any new moles or changes in moles, especially if there is a change in a mole's shape or color.  Also tell your health care provider if you have a mole that is larger than the size of a pencil eraser.  Always use sunscreen. Apply sunscreen liberally and repeatedly throughout the day.  Protect yourself by wearing long sleeves, pants, a wide-brimmed hat, and sunglasses whenever you are outside.  Heart disease, diabetes, and high blood pressure  High blood pressure causes heart disease and increases the risk of stroke. High blood pressure is more likely to develop in: ? People who have blood pressure in the high end of the normal range (130-139/85-89 mm Hg). ? People who are overweight or obese. ? People who are African American.  If you are 17-66 years of age, have your blood pressure checked every 3-5 years. If you are 19 years of age or older, have your blood pressure checked every year. You should have your blood pressure measured twice-once when you are at a hospital or clinic, and once when you are not at a hospital or clinic. Record the average of the two measurements. To check your blood pressure when you are not  at a hospital or clinic, you can use: ? An automated blood pressure machine at a pharmacy. ? A home blood pressure monitor.  If you are between 53 years and 29 years old, ask your health care provider if you should take aspirin to prevent strokes.  Have regular diabetes screenings. This involves taking a blood sample to check your fasting blood sugar level. ? If you are at a normal weight and have a low risk for diabetes, have this test once every three years after 70 years of age. ? If you are overweight and have a high risk for diabetes, consider being tested at a younger age or more often. Preventing infection Hepatitis B  If you have a higher risk for hepatitis B, you should be screened for this virus. You are considered at high risk for hepatitis B if: ? You were born in a country where hepatitis B is common. Ask your health care provider which countries are considered high risk. ? Your parents were born in a high-risk country, and you have not been immunized against hepatitis B (hepatitis B vaccine). ? You have HIV or AIDS. ? You use needles to inject street drugs. ? You live with someone who has hepatitis B. ? You have had sex with someone who has hepatitis B. ? You get hemodialysis treatment. ? You take certain medicines for conditions, including cancer, organ transplantation, and autoimmune conditions.  Hepatitis C  Blood testing is recommended for: ? Everyone born from [birth year] through 1963. ? Anyone with known risk factors for hepatitis C.  Sexually transmitted infections (STIs)  You should be screened for sexually transmitted infections (STIs) including gonorrhea and chlamydia if: ? You are sexually active and are younger than 70 years of age. ? You are older than 70 years of age and your health care provider tells you that you are at risk for this type of infection. ? Your sexual activity has changed since you were last screened and you are at an increased risk for chlamydia  or gonorrhea. Ask your health care provider if you are at risk.  If you do not have HIV, but are at risk, it may be recommended that you take a prescription medicine daily to prevent HIV infection. This is called pre-exposure prophylaxis (PrEP). You are considered at risk if: ? You are sexually active and do not regularly use condoms or know the HIV status of your partner(s). ? You take drugs by injection. ? You are sexually active with a partner who has HIV.  Talk with  your health care provider about whether you are at high risk of being infected with HIV. If you choose to begin PrEP, you should first be tested for HIV. You should then be tested every 3 months for as long as you are taking PrEP. Pregnancy  If you are premenopausal and you may become pregnant, ask your health care provider about preconception counseling.  If you may become pregnant, take 400 to 800 micrograms (mcg) of folic acid every day.  If you want to prevent pregnancy, talk to your health care provider about birth control (contraception). Osteoporosis and menopause  Osteoporosis is a disease in which the bones lose minerals and strength with aging. This can result in serious bone fractures. Your risk for osteoporosis can be identified using a bone density scan.  If you are 57 years of age or older, or if you are at risk for osteoporosis and fractures, ask your health care provider if you should be screened.  Ask your health care provider whether you should take a calcium or vitamin D supplement to lower your risk for osteoporosis.  Menopause may have certain physical symptoms and risks.  Hormone replacement therapy may reduce some of these symptoms and risks. Talk to your health care provider about whether hormone replacement therapy is right for you. Follow these instructions at home:  Schedule regular health, dental, and eye exams.  Stay current with your immunizations.  Do not use any tobacco products  including cigarettes, chewing tobacco, or electronic cigarettes.  If you are pregnant, do not drink alcohol.  If you are breastfeeding, limit how much and how often you drink alcohol.  Limit alcohol intake to no more than 1 drink per day for nonpregnant women. One drink equals 12 ounces of beer, 5 ounces of wine, or 1 ounces of hard liquor.  Do not use street drugs.  Do not share needles.  Ask your health care provider for help if you need support or information about quitting drugs.  Tell your health care provider if you often feel depressed.  Tell your health care provider if you have ever been abused or do not feel safe at home. This information is not intended to replace advice given to you by your health care provider. Make sure you discuss any questions you have with your health care provider. Document Released: 05/13/2011 Document Revised: 04/04/2016 Document Reviewed: 08/01/2015 Elsevier Interactive Patient Education  [employer].

## 2018-02-19 NOTE — Progress Notes (Signed)
AWV reviewed and agree. Signed:  Crissie Sickles, MD           02/19/2018

## 2018-02-20 DIAGNOSIS — M50322 Other cervical disc degeneration at C5-C6 level: Secondary | ICD-10-CM | POA: Diagnosis not present

## 2018-02-20 DIAGNOSIS — M9903 Segmental and somatic dysfunction of lumbar region: Secondary | ICD-10-CM | POA: Diagnosis not present

## 2018-02-20 DIAGNOSIS — M5136 Other intervertebral disc degeneration, lumbar region: Secondary | ICD-10-CM | POA: Diagnosis not present

## 2018-02-20 DIAGNOSIS — M9901 Segmental and somatic dysfunction of cervical region: Secondary | ICD-10-CM | POA: Diagnosis not present

## 2018-02-23 DIAGNOSIS — M50322 Other cervical disc degeneration at C5-C6 level: Secondary | ICD-10-CM | POA: Diagnosis not present

## 2018-02-23 DIAGNOSIS — M9903 Segmental and somatic dysfunction of lumbar region: Secondary | ICD-10-CM | POA: Diagnosis not present

## 2018-02-23 DIAGNOSIS — M5136 Other intervertebral disc degeneration, lumbar region: Secondary | ICD-10-CM | POA: Diagnosis not present

## 2018-02-23 DIAGNOSIS — M9901 Segmental and somatic dysfunction of cervical region: Secondary | ICD-10-CM | POA: Diagnosis not present

## 2018-03-06 DIAGNOSIS — M9901 Segmental and somatic dysfunction of cervical region: Secondary | ICD-10-CM | POA: Diagnosis not present

## 2018-03-06 DIAGNOSIS — M50322 Other cervical disc degeneration at C5-C6 level: Secondary | ICD-10-CM | POA: Diagnosis not present

## 2018-03-06 DIAGNOSIS — M5136 Other intervertebral disc degeneration, lumbar region: Secondary | ICD-10-CM | POA: Diagnosis not present

## 2018-03-06 DIAGNOSIS — M9903 Segmental and somatic dysfunction of lumbar region: Secondary | ICD-10-CM | POA: Diagnosis not present

## 2018-03-12 ENCOUNTER — Ambulatory Visit (INDEPENDENT_AMBULATORY_CARE_PROVIDER_SITE_OTHER): Payer: Medicare HMO | Admitting: Family Medicine

## 2018-03-12 ENCOUNTER — Encounter: Payer: Self-pay | Admitting: Family Medicine

## 2018-03-12 VITALS — BP 122/70 | HR 62 | Temp 98.0°F | Resp 16 | Ht 62.0 in | Wt 137.0 lb

## 2018-03-12 DIAGNOSIS — E7800 Pure hypercholesterolemia, unspecified: Secondary | ICD-10-CM | POA: Diagnosis not present

## 2018-03-12 DIAGNOSIS — Z0000 Encounter for general adult medical examination without abnormal findings: Secondary | ICD-10-CM

## 2018-03-12 MED ORDER — SIMVASTATIN 20 MG PO TABS: 20.0000 mg | ORAL_TABLET | Freq: Every day | ORAL | 3 refills | Status: DC

## 2018-03-12 MED ORDER — CITALOPRAM HYDROBROMIDE 10 MG PO TABS: 10.0000 mg | ORAL_TABLET | Freq: Every day | ORAL | 3 refills | Status: DC

## 2018-03-12 NOTE — Progress Notes (Signed)
OFFICE VISIT  03/12/2018   CC:  Chief Complaint  Patient presents with  . Annual Exam   HPI:    Patient is a 72 y.o. Caucasian female who presents for annual CPE and f/u hypercholesterolemia and GAD.  Home bp monitoring: 120s/72, HR mid 50s. Exercise: walks a lot, push mows her grass, bowls, plays golf. Diet: Healthy diet, has lost 10  Lbs over the last 1 yr due to healthy changes. Dental: preventatives UTD. Eyes: last year.  No acute complaints.  Past Medical History:  Diagnosis Date  . Allergic rhinitis   . Basal cell carcinoma    X 3: one on each leg and one on right shoulder  . Chronic renal insufficiency, stage II (mild)    GFR 60s  . Family history of colon cancer in father    Age 74.  Next colonoscopy due 04/2022.  Marland Kitchen Generalized anxiety disorder   . History of adenomatous polyp of colon 2008; 2013; 04/2017   2018--Recall 5 yrs (Dr. Megan Salon w/[facility])  . Hyperlipidemia, mixed   . Hypertension    took meds x 90mo.  Came off meds and bp ok.  . Mitral valve prolapse   . Osteoarthritis    Fingers and hips.  . Osteopenia     Past Surgical History:  Procedure Laterality Date  . ABDOMINAL HYSTERECTOMY  1979   Done for DUB.  Ovaries are still in.  No further paps necessary.  . APPENDECTOMY  1979  . CARDIOVASCULAR STRESS TEST  2007   Normal stress echo (in [place])  . CATARACT EXTRACTION  07/2013  . COLONOSCOPY  12/2011   Recall 5 yrs  . COLONOSCOPY  04/21/2017   Tubular adenoma x 1. Repeat in 5 years (Dr. [facility])  . DEXA  02/2017   T-score -2.0.  Repeat 2 yrs (Dr. Orvan Seen, [facility]).   Social History   Socioeconomic History  . Marital status: Married    Spouse name: Not on file  . Number of children: Not on file  . Years of education: Not on file  . Highest education level: Not on file  Occupational History  . Not on file  Social Needs  . Financial resource strain: Not on file  . Food insecurity:    Worry: Not on file    Inability: Not on file   . Transportation needs:    Medical: Not on file    Non-medical: Not on file  Tobacco Use  . Smoking status: Never Smoker  . Smokeless tobacco: Never Used  Substance and Sexual Activity  . Alcohol use: Yes    Alcohol/week: 0.6 oz    Types: 1 Glasses of wine per week    Comment: 4-5 times weekly  . Drug use: No  . Sexual activity: Not on file  Lifestyle  . Physical activity:    Days per week: Not on file    Minutes per session: Not on file  . Stress: Not on file  Relationships  . Social connections:    Talks on phone: Not on file    Gets together: Not on file    Attends religious service: Not on file    Active member of club or organization: Not on file    Attends meetings of clubs or organizations: Not on file    Relationship status: Not on file  Other Topics Concern  . Not on file  Social History Narrative   Married, 1 son, 1 grand-daughter.   Orig from [place], then  [place] for [place] yrs, relocated back to [place] [facility] [place] [facility] 2015.   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   NO tob.  Alc: 1 glass wine 4-5 times per week.   Family History  Problem Relation Age of Onset  . Breast cancer Mother        dx'd age 47  . Heart attack Mother   . Heart disease Mother   . Stroke Father   . Hypertension Father   . Cancer Father   . Hypertension Sister   . Cancer Brother   . Hypertension Brother   . Heart attack Brother   . Pancreatitis Brother   . Hypertension Sister   . Myasthenia gravis Sister     She is taking 10 mg citalopram b/c increase to 20mg  qd did not help with hot flashes.  Outpatient Medications Prior to Visit  Medication Sig Dispense Refill  . Calcium Carbonate-Vit D-Min (CALCIUM 1200 PO) Take 1,200 mg by mouth.    . fluticasone (FLONASE) 50 MCG/ACT nasal spray Place 2 sprays into both nostrils daily. 16 g 6  . citalopram (CELEXA) 20 MG tablet TAKE 1 TABLET BY MOUTH EVERY DAY (Patient taking differently: half tablet daily) 30 tablet  6  . simvastatin (ZOCOR) 20 MG tablet TAKE 1 TABLET BY MOUTH DAILY 30 tablet 5  . promethazine (PHENERGAN) 12.5 MG tablet 1-2 po q6h prn for nausea (Patient not taking: Reported on 02/17/2018) 30 tablet 0   No facility-administered medications prior to visit.     No Known Allergies  ROS Review of Systems  Constitutional: Negative for appetite change, chills, fatigue and fever.  HENT: Negative for congestion, dental problem, ear pain and sore throat.   Eyes: Negative for discharge, redness and visual disturbance.  Respiratory: Negative for cough, chest tightness, shortness of breath and wheezing.   Cardiovascular: Negative for chest pain, palpitations and leg swelling.  Gastrointestinal: Negative for abdominal pain, blood in stool, diarrhea, nausea and vomiting.  Genitourinary: Negative for difficulty urinating, dysuria, flank pain, frequency, hematuria and urgency.  Musculoskeletal: Negative for arthralgias, back pain, joint swelling, myalgias and neck stiffness.  Skin: Negative for pallor and rash.  Neurological: Negative for dizziness, speech difficulty, weakness and headaches.  Hematological: Negative for adenopathy. Does not bruise/bleed easily.  Psychiatric/Behavioral: Negative for confusion and sleep disturbance. The patient is not nervous/anxious.      PE: Initial bp 138/72.  Manual repeat 122/78 Blood pressure 138/72, pulse 62, temperature 98 F (36.7 C), temperature source Oral, resp. rate 16, height 5\' 2"  (1.575 m), weight 137 lb (62.1 kg), SpO2 98 %. Exam chaperoned by Starla Link, CMA. Gen: Alert, well appearing.  Patient is oriented to person, place, time, and situation. AFFECT: pleasant, lucid thought and speech. ENT: Ears: EACs clear, normal epithelium.  TMs with good light reflex and landmarks bilaterally.  Eyes: no injection, icteris, swelling, or exudate.  EOMI, PERRLA. Nose: no drainage or turbinate edema/swelling.  No injection or focal lesion.  Mouth: lips without  lesion/swelling.  Oral mucosa pink and moist.  Dentition intact and without obvious caries or gingival swelling.  Oropharynx without erythema, exudate, or swelling.  Neck: supple/nontender.  No LAD, mass, or TM.  Carotid pulses 2+ bilaterally, without bruits. CV: RRR, no m/r/g.   LUNGS: CTA bilat, nonlabored resps, good aeration in all lung fields. ABD: soft, NT, ND, BS normal.  No hepatospenomegaly or mass.  No bruits. EXT: no clubbing, cyanosis, or edema.  Musculoskeletal: no joint swelling, erythema,  warmth, or tenderness.  ROM of all joints intact. Skin - no sores or suspicious lesions or rashes or color changes   LABS:    Chemistry      Component Value Date/Time   NA 138 11/12/2017 1542   K 4.2 11/12/2017 1542   CL 103 11/12/2017 1542   CO2 27 11/12/2017 1542   BUN 17 11/12/2017 1542   CREATININE 0.81 11/12/2017 1542      Component Value Date/Time   CALCIUM 9.2 11/12/2017 1542   ALKPHOS 77 02/03/2017 0958   AST 16 11/12/2017 1542   ALT 12 11/12/2017 1542   BILITOT 0.5 11/12/2017 1542     Lab Results  Component Value Date   CHOL 171 02/03/2017   HDL 37.80 (L) 02/03/2017   LDLCALC 108 (H) 02/03/2017   TRIG 125.0 02/03/2017   CHOLHDL 5 02/03/2017   Lab Results  Component Value Date   WBC 6.3 11/12/2017   HGB 12.9 11/12/2017   HCT 38.0 11/12/2017   MCV 87.4 11/12/2017   PLT 216 11/12/2017    IMPRESSION AND PLAN:  Health maintenance exam: Reviewed age and gender appropriate health maintenance issues (prudent diet, regular exercise, health risks of tobacco and excessive alcohol, use of seatbelts, fire alarms in home, use of sunscreen).  Also reviewed age and gender appropriate health screening as well as vaccine recommendations. Vaccines: UTD--pt has deferred pneumonia vaccines.  Shingrix discussed--she is on waiting list at [facility]. Labs: FLP today---CBC, CMET done 11/2017 o/v during an illness (no repeat needed at this time). Cervical ca screening: n/a--pt had  hysterectomy for benign reasons--GYN does pap smears on her q 3 yrs. Breast ca screening: last [facility] 02/2017 [facility]  Has appt with GYN later this month. Colon ca screening:  Next colonoscopy 2023.  An After Visit Summary was printed and given to the patient.  FOLLOW UP: Return in about 6 months (around 09/12/2018) for routine chronic illness f/u.  Signed:  Crissie Sickles, MD           03/12/2018

## 2018-03-12 NOTE — Patient Instructions (Signed)

## 2018-03-13 LAB — LIPID PANEL
CHOLESTEROL: 200 mg/dL — AB (ref <200)
HDL: 53 mg/dL (ref >50)
LDL CHOLESTEROL (CALC): 122 mg/dL — AB
Non-HDL Cholesterol (Calc): 147 mg/dL (calc) — ABNORMAL HIGH (ref <130)
Total CHOL/HDL Ratio: 3.8 (calc) (ref <5.0)
Triglycerides: 131 mg/dL (ref <150)

## 2018-03-16 ENCOUNTER — Encounter: Payer: Self-pay | Admitting: *Deleted

## 2018-03-24 DIAGNOSIS — M9901 Segmental and somatic dysfunction of cervical region: Secondary | ICD-10-CM | POA: Diagnosis not present

## 2018-03-24 DIAGNOSIS — M50322 Other cervical disc degeneration at C5-C6 level: Secondary | ICD-10-CM | POA: Diagnosis not present

## 2018-03-24 DIAGNOSIS — M9903 Segmental and somatic dysfunction of lumbar region: Secondary | ICD-10-CM | POA: Diagnosis not present

## 2018-03-24 DIAGNOSIS — M5136 Other intervertebral disc degeneration, lumbar region: Secondary | ICD-10-CM | POA: Diagnosis not present

## 2018-04-02 DIAGNOSIS — M9901 Segmental and somatic dysfunction of cervical region: Secondary | ICD-10-CM | POA: Diagnosis not present

## 2018-04-02 DIAGNOSIS — M50322 Other cervical disc degeneration at C5-C6 level: Secondary | ICD-10-CM | POA: Diagnosis not present

## 2018-04-02 DIAGNOSIS — M5136 Other intervertebral disc degeneration, lumbar region: Secondary | ICD-10-CM | POA: Diagnosis not present

## 2018-04-02 DIAGNOSIS — M9903 Segmental and somatic dysfunction of lumbar region: Secondary | ICD-10-CM | POA: Diagnosis not present

## 2018-04-23 DIAGNOSIS — M9901 Segmental and somatic dysfunction of cervical region: Secondary | ICD-10-CM | POA: Diagnosis not present

## 2018-04-23 DIAGNOSIS — M5031 Other cervical disc degeneration,  high cervical region: Secondary | ICD-10-CM | POA: Diagnosis not present

## 2018-04-27 DIAGNOSIS — Z124 Encounter for screening for malignant neoplasm of cervix: Secondary | ICD-10-CM | POA: Diagnosis not present

## 2018-04-27 DIAGNOSIS — Z6825 Body mass index (BMI) 25.0-25.9, adult: Secondary | ICD-10-CM | POA: Diagnosis not present

## 2018-04-27 DIAGNOSIS — R232 Flushing: Secondary | ICD-10-CM | POA: Diagnosis not present

## 2018-04-27 DIAGNOSIS — Z1231 Encounter for screening mammogram for malignant neoplasm of breast: Secondary | ICD-10-CM | POA: Diagnosis not present

## 2018-04-28 DIAGNOSIS — M9901 Segmental and somatic dysfunction of cervical region: Secondary | ICD-10-CM | POA: Diagnosis not present

## 2018-04-28 DIAGNOSIS — M5031 Other cervical disc degeneration,  high cervical region: Secondary | ICD-10-CM | POA: Diagnosis not present

## 2018-04-30 DIAGNOSIS — M5031 Other cervical disc degeneration,  high cervical region: Secondary | ICD-10-CM | POA: Diagnosis not present

## 2018-04-30 DIAGNOSIS — M9901 Segmental and somatic dysfunction of cervical region: Secondary | ICD-10-CM | POA: Diagnosis not present

## 2018-06-04 DIAGNOSIS — R69 Illness, unspecified: Secondary | ICD-10-CM | POA: Diagnosis not present

## 2018-07-14 DIAGNOSIS — M9901 Segmental and somatic dysfunction of cervical region: Secondary | ICD-10-CM | POA: Diagnosis not present

## 2018-07-14 DIAGNOSIS — M5031 Other cervical disc degeneration,  high cervical region: Secondary | ICD-10-CM | POA: Diagnosis not present

## 2018-09-01 DIAGNOSIS — H43812 Vitreous degeneration, left eye: Secondary | ICD-10-CM | POA: Diagnosis not present

## 2018-09-01 DIAGNOSIS — M9904 Segmental and somatic dysfunction of sacral region: Secondary | ICD-10-CM | POA: Diagnosis not present

## 2018-09-01 DIAGNOSIS — M9903 Segmental and somatic dysfunction of lumbar region: Secondary | ICD-10-CM | POA: Diagnosis not present

## 2018-09-01 DIAGNOSIS — R69 Illness, unspecified: Secondary | ICD-10-CM | POA: Diagnosis not present

## 2018-09-01 DIAGNOSIS — M5136 Other intervertebral disc degeneration, lumbar region: Secondary | ICD-10-CM | POA: Diagnosis not present

## 2018-09-01 DIAGNOSIS — M9905 Segmental and somatic dysfunction of pelvic region: Secondary | ICD-10-CM | POA: Diagnosis not present

## 2018-09-04 DIAGNOSIS — M47812 Spondylosis without myelopathy or radiculopathy, cervical region: Secondary | ICD-10-CM | POA: Diagnosis not present

## 2018-09-15 ENCOUNTER — Encounter: Payer: Self-pay | Admitting: Family Medicine

## 2018-09-15 ENCOUNTER — Ambulatory Visit (INDEPENDENT_AMBULATORY_CARE_PROVIDER_SITE_OTHER): Payer: Medicare HMO | Admitting: Family Medicine

## 2018-09-15 VITALS — BP 156/88 | HR 71 | Resp 16 | Ht 62.0 in | Wt 139.0 lb

## 2018-09-15 DIAGNOSIS — E7800 Pure hypercholesterolemia, unspecified: Secondary | ICD-10-CM | POA: Diagnosis not present

## 2018-09-15 DIAGNOSIS — T887XXA Unspecified adverse effect of drug or medicament, initial encounter: Secondary | ICD-10-CM

## 2018-09-15 DIAGNOSIS — I10 Essential (primary) hypertension: Secondary | ICD-10-CM

## 2018-09-15 DIAGNOSIS — Z8679 Personal history of other diseases of the circulatory system: Secondary | ICD-10-CM

## 2018-09-15 DIAGNOSIS — F411 Generalized anxiety disorder: Secondary | ICD-10-CM | POA: Diagnosis not present

## 2018-09-15 DIAGNOSIS — R69 Illness, unspecified: Secondary | ICD-10-CM | POA: Diagnosis not present

## 2018-09-15 MED ORDER — LISINOPRIL 5 MG PO TABS: ORAL_TABLET | ORAL | 0 refills | Status: DC

## 2018-09-15 NOTE — Patient Instructions (Signed)
Take one 5mg  lisinopril tab daily. Monitor bp and hr daily and write numbers down to bring in for review with me at f/u in 2 wks.  In 1 week, if bp not consistently <130 on top or< 80 on bottom, then increase to TWO of the 5 mg lisinopril tabs daily.

## 2018-09-15 NOTE — Progress Notes (Signed)
OFFICE VISIT  09/15/2018   CC:  Chief Complaint  Patient presents with  . Follow-up    RCI    HPI:    Patient is a 72 y.o. Caucasian female who presents for 6 mo f/u GAD, HLD, hx of HTN.  Pt stopped her citalopram b/c she was having heat waves/hot flashes.  She felt like the celexa was causing this so she stopped the celexa and her "heat waves have stopped".  Anxiety level is not a problem, nor is mood a problem since being off citalopram. However, BP elevation has re-emerged and she can feel a sensation in her face that she identifies with bp being up. Home bp's: 145-150/80s avg over the last 2 wks.  HR 60s for the most part. She is active: golfing, bowling, push mows grass. Diet is good.  HLD: tolerating zocor 20mg  qd.  ROS: no CP, no SOB, no wheezing, no cough, no dizziness, no HAs, no rashes, no melena/hematochezia.  No polyuria or polydipsia.  No myalgias or arthralgias.  Past Medical History:  Diagnosis Date  . Allergic rhinitis   . Basal cell carcinoma    X 3: one on each leg and one on right shoulder  . Chronic renal insufficiency, stage II (mild)    GFR 60s  . Family history of colon cancer in father    Age 27.  Next colonoscopy due 04/2022.  Marland Kitchen Generalized anxiety disorder   . History of adenomatous polyp of colon 2008; 2013; 04/2017   2018--Recall 5 yrs (Dr. Megan Salon w/[facility])  . Hyperlipidemia, mixed   . Hypertension    took meds x 22mo.  Came off meds and bp ok.  . Mitral valve prolapse   . Osteoarthritis    Fingers and hips.  . Osteopenia     Past Surgical History:  Procedure Laterality Date  . ABDOMINAL HYSTERECTOMY  1979   Done for DUB.  Ovaries are still in.  No further paps necessary.  . APPENDECTOMY  1979  . CARDIOVASCULAR STRESS TEST  2007   Normal stress echo (in [place])  . CATARACT EXTRACTION  07/2013  . COLONOSCOPY  12/2011   Recall 5 yrs  . COLONOSCOPY  04/21/2017   Tubular adenoma x 1. Repeat in 5 years (Dr. [facility])  . DEXA  02/2017    T-score -2.0.  Repeat 2 yrs (Dr. Orvan Seen, [facility]).    Outpatient Medications Prior to Visit  Medication Sig Dispense Refill  . fluticasone (FLONASE) 50 MCG/ACT nasal spray Place 2 sprays into both nostrils daily. 16 g 6  . simvastatin (ZOCOR) 20 MG tablet Take 1 tablet (20 mg total) by mouth daily. 90 tablet 3  . Calcium Carbonate-Vit D-Min (CALCIUM 1200 PO) Take 1,200 mg by mouth.    . citalopram (CELEXA) 10 MG tablet Take 1 tablet (10 mg total) by mouth daily. (Patient not taking: Reported on 09/15/2018) 90 tablet 3   No facility-administered medications prior to visit.     No Known Allergies  ROS As per HPI  PE: Blood pressure (!) 156/88, pulse 71, resp. rate 16, height 5\' 2"  (1.575 m), weight 139 lb (63 kg), SpO2 99 %. Gen: Alert, well appearing.  Patient is oriented to person, place, time, and situation. AFFECT: pleasant, lucid thought and speech. CV: RRR, no m/r/g.   LUNGS: CTA bilat, nonlabored resps, good aeration in all lung fields. EXT: no clubbing or cyanosis.  no edema.    LABS:  Lab Results  Component Value Date  TSH 3.56 11/23/2014   Lab Results  Component Value Date   WBC 6.3 11/12/2017   HGB 12.9 11/12/2017   HCT 38.0 11/12/2017   MCV 87.4 11/12/2017   PLT 216 11/12/2017   Lab Results  Component Value Date   CREATININE 0.81 11/12/2017   BUN 17 11/12/2017   NA 138 11/12/2017   K 4.2 11/12/2017   CL 103 11/12/2017   CO2 27 11/12/2017   Lab Results  Component Value Date   ALT 12 11/12/2017   AST 16 11/12/2017   ALKPHOS 77 02/03/2017   BILITOT 0.5 11/12/2017   Lab Results  Component Value Date   CHOL 200 (H) 03/12/2018   Lab Results  Component Value Date   HDL 53 03/12/2018   Lab Results  Component Value Date   LDLCALC 122 (H) 03/12/2018   Lab Results  Component Value Date   TRIG 131 03/12/2018   Lab Results  Component Value Date   CHOLHDL 3.8 03/12/2018     IMPRESSION AND PLAN:  1) GAD: doing well OFF of  citalopram.  2) HLD: tolerating statin.  FLP today.  3) HTN: re-emergence since getting off of citalopram-->unclear if there is really a connection there. Start lisinopril 5mg  qd. Instructions: Take one 5mg  lisinopril tab daily. Monitor bp and hr daily and write numbers down to bring in for review with me at f/u in 2 wks. In 1 week, if bp not consistently <130 on top or< 80 on bottom, then increase to TWO of the 5 mg lisinopril tabs daily. BMET today.  An After Visit Summary was printed and given to the patient.  FOLLOW UP: Return in about 2 weeks (around 09/29/2018) for f/u HTN.  Next CPE 6 mo.  Signed:  Crissie Sickles, MD           09/15/2018

## 2018-09-16 LAB — LIPID PANEL
Cholesterol: 207 mg/dL — ABNORMAL HIGH (ref <200)
HDL: 46 mg/dL — AB (ref >50)
LDL CHOLESTEROL (CALC): 135 mg/dL — AB
NON-HDL CHOLESTEROL (CALC): 161 mg/dL — AB (ref <130)
TRIGLYCERIDES: 134 mg/dL (ref <150)
Total CHOL/HDL Ratio: 4.5 (calc) (ref <5.0)

## 2018-09-16 LAB — BASIC METABOLIC PANEL
BUN: 14 mg/dL (ref 7–25)
CALCIUM: 9.6 mg/dL (ref 8.6–10.4)
CO2: 27 mmol/L (ref 20–32)
Chloride: 104 mmol/L (ref 98–110)
Creat: 0.83 mg/dL (ref 0.60–0.93)
GLUCOSE: 88 mg/dL (ref 65–99)
Potassium: 4.8 mmol/L (ref 3.5–5.3)
SODIUM: 140 mmol/L (ref 135–146)

## 2018-09-16 LAB — EXTRA LAV TOP TUBE

## 2018-09-18 ENCOUNTER — Other Ambulatory Visit: Payer: Self-pay | Admitting: Family Medicine

## 2018-09-29 ENCOUNTER — Encounter: Payer: Self-pay | Admitting: Family Medicine

## 2018-09-29 ENCOUNTER — Ambulatory Visit (INDEPENDENT_AMBULATORY_CARE_PROVIDER_SITE_OTHER): Payer: Medicare HMO | Admitting: Family Medicine

## 2018-09-29 VITALS — BP 149/81 | HR 62 | Temp 98.1°F | Resp 16 | Ht 62.0 in | Wt 140.2 lb

## 2018-09-29 DIAGNOSIS — F419 Anxiety disorder, unspecified: Secondary | ICD-10-CM

## 2018-09-29 DIAGNOSIS — I10 Essential (primary) hypertension: Secondary | ICD-10-CM

## 2018-09-29 DIAGNOSIS — E7800 Pure hypercholesterolemia, unspecified: Secondary | ICD-10-CM

## 2018-09-29 DIAGNOSIS — R69 Illness, unspecified: Secondary | ICD-10-CM | POA: Diagnosis not present

## 2018-09-29 LAB — BASIC METABOLIC PANEL
BUN: 12 mg/dL (ref 6–23)
CHLORIDE: 104 meq/L (ref 96–112)
CO2: 29 meq/L (ref 19–32)
CREATININE: 0.79 mg/dL (ref 0.40–1.20)
Calcium: 9.4 mg/dL (ref 8.4–10.5)
GFR: 76.36 mL/min (ref >60.00)
Glucose, Bld: 85 mg/dL (ref 70–99)
POTASSIUM: 4.1 meq/L (ref 3.5–5.1)
SODIUM: 140 meq/L (ref 135–145)

## 2018-09-29 MED ORDER — LORAZEPAM 0.5 MG PO TABS: ORAL_TABLET | ORAL | 1 refills | Status: DC

## 2018-09-29 MED ORDER — LISINOPRIL 10 MG PO TABS: 10.0000 mg | ORAL_TABLET | Freq: Every day | ORAL | 1 refills | Status: DC

## 2018-09-29 MED ORDER — SIMVASTATIN 40 MG PO TABS: 40.0000 mg | ORAL_TABLET | Freq: Every day | ORAL | 1 refills | Status: DC

## 2018-09-29 NOTE — Progress Notes (Signed)
OFFICE VISIT  09/29/2018   CC:  Chief Complaint  Patient presents with  . Follow-up    HTN, pt is fasting.    HPI:    Patient is a 72 y.o. Caucasian female who presents for 2 week f/u HTN. Last visit I started her on lisinopril 5mg  qd, with instructions for titrating up to 10mg  qd if bp not responding adequately.  I also checked her lipids last visit and numbers were up so I recommended she increase her zocor to 40 mg qd-->message left on her voicemail.  HTN: she has titrated dose to TWO of the 5mg  tabs qd BPs improved to 109 avg systolic over 70 avg diastolic.  However, yesterday and today her bp has been back up to 323-557 systolic and she feels the flushing in her face that she identifies with having elevated bp. When we were discussing this today she began to tear up.  She said she is emotional and anxious about having high bp b/c her father died of a stroke.  She admits to anxiety having started again after recently self weening off of her citalopram, but it is just on some days, and usually not for the entire day.  Otherwise she feels like she is emotionally doing well and she is not interested in restarting a daily antidepressant at this time. We discussed use of benzo on prn basis, which could help with her anxiety when it gets to be really troublesome, and will likely have side effect of helping elevated bp's in these situations.  HLD: she has increased her dose of simva to TWO of the 20mg  tabs daily as we instructed. Needs rx for 40mg  tabs.  ROS: no CP, no SOB, no wheezing, no cough, no dizziness, no HAs, no rashes, no melena/hematochezia.  No polyuria or polydipsia.  No myalgias or arthralgias.   Past Medical History:  Diagnosis Date  . Allergic rhinitis   . Basal cell carcinoma    X 3: one on each leg and one on right shoulder  . Chronic renal insufficiency, stage II (mild)    GFR 60s  . Family history of colon cancer in father    Age 22.  Next colonoscopy due 04/2022.   Marland Kitchen Generalized anxiety disorder   . History of adenomatous polyp of colon 2008; 2013; 04/2017   2018--Recall 5 yrs (Dr. Megan Salon w/[facility])  . Hyperlipidemia, mixed   . Hypertension    took meds x 15mo.  Came off meds and bp ok.  . Mitral valve prolapse   . Osteoarthritis    Fingers and hips.  . Osteopenia     Past Surgical History:  Procedure Laterality Date  . ABDOMINAL HYSTERECTOMY  1979   Done for DUB.  Ovaries are still in.  No further paps necessary.  . APPENDECTOMY  1979  . CARDIOVASCULAR STRESS TEST  2007   Normal stress echo (in [place])  . CATARACT EXTRACTION  07/2013  . COLONOSCOPY  12/2011   Recall 5 yrs  . COLONOSCOPY  04/21/2017   Tubular adenoma x 1. Repeat in 5 years (Dr. [facility])  . DEXA  02/2017   T-score -2.0.  Repeat 2 yrs (Dr. Or[facility] Se[facility], [facility]).    Outpatient Medications Prior to Visit  Medication Sig Dispense Refill  . fluticasone (FLONASE) 50 MCG/ACT nasal spray Place 2 sprays into both nostrils daily. 16 g 6  . lisinopril (PRINIVIL,ZESTRIL) 5 MG tablet 1-2 tabs po qd 60 tablet 0  . simvastatin (ZOCOR) 20 MG  tablet Take 1 tablet (20 mg total) by mouth daily. 90 tablet 3  . Calcium Carbonate-Vit D-Min (CALCIUM 1200 PO) Take 1,200 mg by mouth.     No facility-administered medications prior to visit.     Allergies  Allergen Reactions  . Citalopram Other (See Comments)    Heatwaves/hot flashes    ROS As per HPI  PE: Blood pressure (!) 149/81, pulse 62, temperature 98.1 F (36.7 C), temperature source Oral, resp. rate 16, height 5\' 2"  (1.575 m), weight 140 lb 4 oz (63.6 kg), SpO2 100 %. Gen: Alert, well appearing.  Patient is oriented to person, place, time, and situation. AFFECT: pleasant, lucid thought and speech. No further exam today.  LABS:    Chemistry      Component Value Date/Time   NA 140 09/15/2018 0911   K 4.8 09/15/2018 0911   CL 104 09/15/2018 0911   CO2 27 09/15/2018 0911   BUN 14 09/15/2018 0911   CREATININE 0.83  09/15/2018 0911      Component Value Date/Time   CALCIUM 9.6 09/15/2018 0911   ALKPHOS 77 02/03/2017 0958   AST 16 11/12/2017 1542   ALT 12 11/12/2017 1542   BILITOT 0.5 11/12/2017 1542     Lab Results  Component Value Date   CHOL 207 (H) 09/15/2018   HDL 46 (L) 09/15/2018   LDLCALC 135 (H) 09/15/2018   TRIG 134 09/15/2018   CHOLHDL 4.5 09/15/2018    IMPRESSION AND PLAN:  1) HTN, good control on lisinopril 10mg  qd-->continue this. She has an anxiety component causing some elevations, but I don't think we should address this by increasing her dose of bp med today.  See #2 below. BMET today.  2) Anxiety, intermittent, largely related to fear of HTN and potential short/long term ramifications: discussed this with her today and acknowledged that this is a normal feeling.  As per HPI-->no antidepressant med trial, but we decided on lorazepam 0.5mg , 1-2 bid prn, #60, RF x 1.  Therapeutic expectations and side effect profile of medication discussed today.  Patient's questions answered.  3) Hypercholesterolemia, not well controlled. We just increased her simva dose to 40mg  qd. Plan recheck FLP in 2 mo.  An After Visit Summary was printed and given to the patient.  FOLLOW UP: Return for Cancel 11/18/17 lab visit.  Needs 30 min office f/u in 2 months--HTN/anxiety.Recheck FLP at that time.  Signed:  Crissie Sickles, MD           09/29/2018

## 2018-10-07 ENCOUNTER — Other Ambulatory Visit: Payer: Self-pay | Admitting: Family Medicine

## 2018-10-12 ENCOUNTER — Ambulatory Visit: Payer: Self-pay

## 2018-10-12 MED ORDER — MECLIZINE HCL 25 MG PO TABS: 25.0000 mg | ORAL_TABLET | Freq: Three times a day (TID) | ORAL | 0 refills | Status: DC | PRN

## 2018-10-12 NOTE — Telephone Encounter (Signed)
Please advise. Thanks.  

## 2018-10-12 NOTE — Telephone Encounter (Signed)
Pt advised and voiced understanding.  Rx sent.  

## 2018-10-12 NOTE — Telephone Encounter (Addendum)
Incoming call from patient, state that she has been experiencing dizziness since 10/02/18.  Patient states she get a sensation of a wave of feeling rocky.  States she has tried Dramamine and  Sudafed. States that on 10/02/18 she just rolled from side to side,  when it first occurred.  In her bed to see if  would  relieve the sx.  She states that the room is appearing to be spinning.  Reports a history of vertigo in the past. Patient reports if she lays  Down., it will go away.  Patient states when she experienced vertigo in the past: Dr called in  [place].  Patient inquires  if she could get something called in until she gets to her appointment.   Patient states that ever since she started taking Lisinopril  It seems as though she has been experiencing these symptoms.    Reason for Disposition . [1] MILD dizziness (e.g., vertigo; walking normally) AND [2] has NOT been evaluated by physician for this  Answer Assessment - Initial Assessment Questions 1. DESCRIPTION: "Describe your dizziness."     spining 2. VERTIGO: "Do you feel like either you or the room is spinning or tilting?"      yes 3. LIGHTHEADED: "Do you feel lightheaded?" (e.g., somewhat faint, woozy, weak upon standing)     Get dizzy  4. SEVERITY: "How bad is it?"  "Can you walk?"   - MILD - Feels unsteady but walking normally.   - MODERATE - Feels very unsteady when walking, but not falling; interferes with normal activities (e.g., school, work) .   - SEVERE - Unable to walk without falling (requires assistance).     moderat 5. ONSET:  "When did the dizziness begin?"     10/02/18 Friday am 6. AGGRAVATING FACTORS: "Does anything make it worse?" (e.g., standing, change in head position)     layed head down  It will go away in a fee seconds 7. CAUSE: "What do you think is causing the dizziness?"     Change blood pressure medication 8. RECURRENT SYMPTOM: "Have you had dizziness before?" If so, ask: "When was the last time?" "What happened  that time?"     Antivert 9. OTHER SYMPTOMS: "Do you have any other symptoms?" (e.g., headache, weakness, numbness, vomiting, earache)     no 10. PREGNANCY: "Is there any chance you are pregnant?" "When was your last menstrual period?"       Na denies numbeness  Protocols used: DIZZINESS - VERTIGO-A-AH

## 2018-10-12 NOTE — Telephone Encounter (Signed)
Appointment scheduled for 3:15pm with arrival at 3:00pm.  Patient voiced understanding.

## 2018-10-12 NOTE — Addendum Note (Signed)
Addended by: Onalee Hua on: 10/12/2018 03:13 PM   Modules accepted: Orders

## 2018-10-12 NOTE — Telephone Encounter (Signed)
Pls eRx meclizine 25mg , 1 tab po tid prn dizziness, #30, no RF.

## 2018-10-14 ENCOUNTER — Ambulatory Visit (INDEPENDENT_AMBULATORY_CARE_PROVIDER_SITE_OTHER): Payer: Medicare HMO | Admitting: Family Medicine

## 2018-10-14 ENCOUNTER — Encounter: Payer: Self-pay | Admitting: Family Medicine

## 2018-10-14 VITALS — BP 150/81 | HR 63 | Temp 98.0°F | Resp 16 | Ht 62.0 in | Wt 140.1 lb

## 2018-10-14 DIAGNOSIS — H8110 Benign paroxysmal vertigo, unspecified ear: Secondary | ICD-10-CM | POA: Diagnosis not present

## 2018-10-14 DIAGNOSIS — I10 Essential (primary) hypertension: Secondary | ICD-10-CM

## 2018-10-14 NOTE — Progress Notes (Signed)
OFFICE VISIT  10/14/2018   CC:  Chief Complaint  Patient presents with  . Dizziness    HPI:    Patient is a 72 y.o. Caucasian female who presents for dizziness. Onset 12 d/a, rolled over in bed and felt her room spinning and felt like her eyes were moving back and forth.  Lasts a few seconds.  Lying back triggers things the most commonly.  Most recent episode was last night.  No nausea with this. No hearing changes.  Has chronic tinnitus.  Between episodes of vertigo she feels disequilibrium but functions ok. She took meclizine this morning and this helped a bit with disequilibrium sensation.  Home bp monitoring 130s/70s in morning before taking her meds. She has not tried her lorazepam.  No URI sx's, no ear complaints.  No n/v.     Past Medical History:  Diagnosis Date  . Allergic rhinitis   . Basal cell carcinoma    X 3: one on each leg and one on right shoulder  . Chronic renal insufficiency, stage II (mild)    GFR 60s  . Family history of colon cancer in father    Age 12.  Next colonoscopy due 04/2022.  Marland Kitchen Generalized anxiety disorder   . History of adenomatous polyp of colon 2008; 2013; 04/2017   2018--Recall 5 yrs (Dr. Megan Salon w/[facility])  . Hyperlipidemia, mixed   . Hypertension    took meds x 52mo.  Came off meds and bp ok.  . Mitral valve prolapse   . Osteoarthritis    Fingers and hips.  . Osteopenia     Past Surgical History:  Procedure Laterality Date  . ABDOMINAL HYSTERECTOMY  1979   Done for DUB.  Ovaries are still in.  No further paps necessary.  . APPENDECTOMY  1979  . CARDIOVASCULAR STRESS TEST  2007   Normal stress echo (in [place])  . CATARACT EXTRACTION  07/2013  . COLONOSCOPY  12/2011   Recall 5 yrs  . COLONOSCOPY  04/21/2017   Tubular adenoma x 1. Repeat in 5 years (Dr. [facility])  . DEXA  02/2017   T-score -2.0.  Repeat 2 yrs (Dr. Orvan Se[facility], [facility]).    Outpatient Medications Prior to Visit  Medication Sig Dispense Refill  .  fluticasone (FLONASE) 50 MCG/ACT nasal spray Place 2 sprays into both nostrils daily. 16 g 6  . lisinopril (PRINIVIL,ZESTRIL) 10 MG tablet Take 1 tablet (10 mg total) by mouth daily. 90 tablet 1  . LORazepam (ATIVAN) 0.5 MG tablet 1-2 tabs po bid prn anxiety 60 tablet 1  . meclizine (ANTIVERT) 25 MG tablet Take 1 tablet (25 mg total) by mouth 3 (three) times daily as needed for dizziness. 30 tablet 0  . simvastatin (ZOCOR) 40 MG tablet Take 1 tablet (40 mg total) by mouth at bedtime. 90 tablet 1   No facility-administered medications prior to visit.     Allergies  Allergen Reactions  . Citalopram Other (See Comments)    Heatwaves/hot flashes    ROS As per HPI  PE: Blood pressure (!) 150/81, pulse 63, temperature 98 F (36.7 C), temperature source Oral, resp. rate 16, height 5\' 2"  (1.575 m), weight 140 lb 2 oz (63.6 kg), SpO2 98 %. Gen: Alert, well appearing.  Patient is oriented to person, place, time, and situation. AFFECT: pleasant, lucid thought and speech. JSH:FWYO: no injection, icteris, swelling, or exudate.  EOMI, PERRLA. Mouth: lips without lesion/swelling.  Oral mucosa pink and moist. Oropharynx without erythema, exudate,  or swelling.  CV: RRR, no m/r/g.   LUNGS: CTA bilat, nonlabored resps, good aeration in all lung fields. Neuro: CN 2-12 intact bilaterally, strength 5/5 in proximal and distal upper extremities and lower extremities bilaterally.   No tremor.  FNF normal bilat.  No ataxia.  Upper extremity and lower extremity DTRs symmetric.  No pronator drift. Dix-Halpike: neg w/turn to R lying supine, but + vertigo sitting up ->no nystagmus.  Resolved in 6-8 seconds. POS with head to L going supine, with rotary nystagmus, resolved in 15 sec.  Upon sitting up her vertigo returned but no nystagmus.  Resolved in 10-12 seconds.  LABS:    Chemistry      Component Value Date/Time   NA 140 09/29/2018 0949   K 4.1 09/29/2018 0949   CL 104 09/29/2018 0949   CO2 29 09/29/2018  0949   BUN 12 09/29/2018 0949   CREATININE 0.79 09/29/2018 0949   CREATININE 0.83 09/15/2018 0911      Component Value Date/Time   CALCIUM 9.4 09/29/2018 0949   ALKPHOS 77 02/03/2017 0958   AST 16 11/12/2017 1542   ALT 12 11/12/2017 1542   BILITOT 0.5 11/12/2017 1542      IMPRESSION AND PLAN:  1) BPPV, unspecified laterality.  Has lasted 12 days now. Epley maneuvers reviewed, handout given. If not significantly improving in 5d then we'll get her into vestibular rehab. She can continue meclizine 25mg  tid prn OR she can try a lorazepam 0.5mg  bid prn to see if this helps with her disequilibrium feeling that she has between episodes of vertigo. Signs/symptoms to call or return for were reviewed and pt expressed understanding.  2) HTN: well controlled per home bp monitoring.  An After Visit Summary was printed and given to the patient.  FOLLOW UP: Return if symptoms worsen or fail to improve.  Signed:  Crissie Sickles, MD           10/14/2018

## 2018-10-16 ENCOUNTER — Other Ambulatory Visit: Payer: Self-pay | Admitting: Family Medicine

## 2018-11-18 ENCOUNTER — Other Ambulatory Visit: Payer: Medicare HMO

## 2018-11-25 ENCOUNTER — Other Ambulatory Visit: Payer: Medicare HMO

## 2018-12-01 ENCOUNTER — Ambulatory Visit (INDEPENDENT_AMBULATORY_CARE_PROVIDER_SITE_OTHER): Payer: Medicare HMO | Admitting: Family Medicine

## 2018-12-01 ENCOUNTER — Encounter: Payer: Self-pay | Admitting: Family Medicine

## 2018-12-01 ENCOUNTER — Encounter: Payer: Self-pay | Admitting: *Deleted

## 2018-12-01 VITALS — BP 128/73 | HR 60 | Temp 97.7°F | Resp 16 | Ht 62.0 in | Wt 143.8 lb

## 2018-12-01 DIAGNOSIS — E7800 Pure hypercholesterolemia, unspecified: Secondary | ICD-10-CM | POA: Diagnosis not present

## 2018-12-01 MED ORDER — LISINOPRIL 5 MG PO TABS: 5.0000 mg | ORAL_TABLET | Freq: Every day | ORAL | 1 refills | Status: DC

## 2018-12-01 NOTE — Progress Notes (Signed)
OFFICE VISIT  12/01/2018   CC:  Chief Complaint  Patient presents with  . Follow-up    HTN and Anxiety     HPI:    Patient is a 73 y.o. Caucasian female who presents for 2 mo f/u HTN, intermittent anxiety due to her fear of HTN and it's short and long term potential ramifications, and hyperlipidemia.  HTN: kept med dosing the same last visit.  She was having occ increased bp mainly associated with times of increased anxiety. BMET was normal. Avg bp at home 130/80 on lisinopril 5mg  -->the 10mg  caused hypotension.  Anxiety: decided against restart of daily antidepressant last visit, and did trial of lorazepam on prn basis. Rx filled 09/29/18, #60.   She feels a moderately signif level of anxiety a couple times per week on average and takes ONE lorazepam and feels MUCH improved, w/out side effect.  Hyperlipidemia: about 3 mo ago her lipids were a bit elevated on zocor 20 mg so I increased her dose to 40mg  qd. Mild increase in tension of jaw muscles noted, but this is tolerable and improving. She is due for recheck FLP.  ROS: no CP, no SOB, no wheezing, no cough, no dizziness, no HAs, no rashes, no melena/hematochezia.  No polyuria or polydipsia.  No myalgias or arthralgias.  Past Medical History:  Diagnosis Date  . Allergic rhinitis   . Basal cell carcinoma    X 3: one on each leg and one on right shoulder  . Chronic renal insufficiency, stage II (mild)    GFR 60s  . Family history of colon cancer in father    Age 53.  Next colonoscopy due 04/2022.  Marland Kitchen Generalized anxiety disorder   . History of adenomatous polyp of colon 2008; 2013; 04/2017   2018--Recall 5 yrs (Dr. Megan Salon w/[facility])  . Hyperlipidemia, mixed   . Hypertension    took meds x 64mo.  Came off meds and bp ok.  . Mitral valve prolapse   . Osteoarthritis    Fingers and hips.  . Osteopenia     Past Surgical History:  Procedure Laterality Date  . ABDOMINAL HYSTERECTOMY  1979   Done for DUB.  Ovaries are still  in.  No further paps necessary.  . APPENDECTOMY  1979  . CARDIOVASCULAR STRESS TEST  2007   Normal stress echo (in [place])  . CATARACT EXTRACTION  07/2013  . COLONOSCOPY  12/2011   Recall 5 yrs  . COLONOSCOPY  04/21/2017   Tubular adenoma x 1. Repeat in 5 years (Dr. [facility])  . DEXA  02/2017   T-score -2.0.  Repeat 2 yrs (Dr. Orvan Se[facility], [facility]).    Outpatient Medications Prior to Visit  Medication Sig Dispense Refill  . fluticasone (FLONASE) 50 MCG/ACT nasal spray Place 2 sprays into both nostrils daily. 16 g 6  . LORazepam (ATIVAN) 0.5 MG tablet 1-2 tabs po bid prn anxiety 60 tablet 1  . meclizine (ANTIVERT) 25 MG tablet Take 1 tablet (25 mg total) by mouth 3 (three) times daily as needed for dizziness. 30 tablet 0  . simvastatin (ZOCOR) 40 MG tablet Take 1 tablet (40 mg total) by mouth at bedtime. 90 tablet 1  . lisinopril (PRINIVIL,ZESTRIL) 5 MG tablet Take 5 mg by mouth daily.    Marland Kitchen lisinopril (PRINIVIL,ZESTRIL) 10 MG tablet Take 1 tablet (10 mg total) by mouth daily. (Patient not taking: Reported on 12/01/2018) 90 tablet 1   No facility-administered medications prior to visit.  Allergies  Allergen Reactions  . Citalopram Other (See Comments)    Heatwaves/hot flashes    ROS As per HPI  PE: Blood pressure 128/73, pulse 60, temperature 97.7 F (36.5 C), temperature source Oral, resp. rate 16, height 5\' 2"  (1.575 m), weight 143 lb 13 oz (65.2 kg), SpO2 100 %. Gen: Alert, well appearing.  Patient is oriented to person, place, time, and situation. AFFECT: pleasant, lucid thought and speech. No further exam today.  LABS:    Chemistry      Component Value Date/Time   NA 140 09/29/2018 0949   K 4.1 09/29/2018 0949   CL 104 09/29/2018 0949   CO2 29 09/29/2018 0949   BUN 12 09/29/2018 0949   CREATININE 0.79 09/29/2018 0949   CREATININE 0.83 09/15/2018 0911      Component Value Date/Time   CALCIUM 9.4 09/29/2018 0949   ALKPHOS 77 02/03/2017 0958   AST 16  11/12/2017 1542   ALT 12 11/12/2017 1542   BILITOT 0.5 11/12/2017 1542       IMPRESSION AND PLAN:  1) HTN: The current medical regimen is effective;  continue present plan and medications. Lisinopril 5mg  qd eRx'd.  2) Intermittent/situational anxiety: Great response to prn lorazepam. She definitely uses this in a responsible way Controlled substance contract reviewed with patient today.  Patient signed this and it will be placed in the chart.    3) Hypercholesterolemia: due for FLP today-->increased zocor to 40mg  qd a couple months ago.  An After Visit Summary was printed and given to the patient.  FOLLOW UP: Return in about 6 months (around 06/01/2019) for annual CPE (fasting).  Signed:  Crissie Sickles, MD           12/01/2018

## 2018-12-02 LAB — LIPID PANEL
Cholesterol: 158 mg/dL (ref <200)
HDL: 47 mg/dL — AB (ref >50)
LDL CHOLESTEROL (CALC): 90 mg/dL
Non-HDL Cholesterol (Calc): 111 mg/dL (calc) (ref <130)
TRIGLYCERIDES: 112 mg/dL (ref <150)
Total CHOL/HDL Ratio: 3.4 (calc) (ref <5.0)

## 2018-12-08 DIAGNOSIS — R69 Illness, unspecified: Secondary | ICD-10-CM | POA: Diagnosis not present

## 2019-02-23 ENCOUNTER — Ambulatory Visit: Payer: Medicare HMO

## 2019-03-05 ENCOUNTER — Other Ambulatory Visit: Payer: Self-pay

## 2019-03-05 MED ORDER — LORAZEPAM 0.5 MG PO TABS: ORAL_TABLET | ORAL | 1 refills | Status: DC

## 2019-03-05 NOTE — Telephone Encounter (Signed)
RF request for Lorazepam LOV: 12/01/18, f/u [facility] Next ov: none Last written: 09/29/18 #60 w/ 1RF.  Please advise, thanks. PMP aware printed and medication pending.

## 2019-03-19 ENCOUNTER — Other Ambulatory Visit: Payer: Self-pay

## 2019-03-19 MED ORDER — SIMVASTATIN 40 MG PO TABS: 40.0000 mg | ORAL_TABLET | Freq: Every day | ORAL | 1 refills | Status: DC

## 2019-05-06 DIAGNOSIS — Z01419 Encounter for gynecological examination (general) (routine) without abnormal findings: Secondary | ICD-10-CM | POA: Diagnosis not present

## 2019-05-06 DIAGNOSIS — M8588 Other specified disorders of bone density and structure, other site: Secondary | ICD-10-CM | POA: Diagnosis not present

## 2019-05-06 DIAGNOSIS — Z1231 Encounter for screening mammogram for malignant neoplasm of breast: Secondary | ICD-10-CM | POA: Diagnosis not present

## 2019-05-06 DIAGNOSIS — Z6825 Body mass index (BMI) 25.0-25.9, adult: Secondary | ICD-10-CM | POA: Diagnosis not present

## 2019-05-06 DIAGNOSIS — N958 Other specified menopausal and perimenopausal disorders: Secondary | ICD-10-CM | POA: Diagnosis not present

## 2019-05-06 LAB — HM DEXA SCAN

## 2019-05-06 LAB — HM MAMMOGRAPHY

## 2019-05-07 ENCOUNTER — Encounter: Payer: Self-pay | Admitting: Family Medicine

## 2019-05-07 ENCOUNTER — Ambulatory Visit (INDEPENDENT_AMBULATORY_CARE_PROVIDER_SITE_OTHER): Payer: Medicare HMO | Admitting: Family Medicine

## 2019-05-07 ENCOUNTER — Other Ambulatory Visit: Payer: Self-pay

## 2019-05-07 VITALS — BP 148/84 | HR 65 | Temp 98.3°F | Resp 16 | Ht 62.0 in | Wt 141.0 lb

## 2019-05-07 DIAGNOSIS — E7800 Pure hypercholesterolemia, unspecified: Secondary | ICD-10-CM

## 2019-05-07 DIAGNOSIS — F411 Generalized anxiety disorder: Secondary | ICD-10-CM | POA: Diagnosis not present

## 2019-05-07 DIAGNOSIS — R69 Illness, unspecified: Secondary | ICD-10-CM | POA: Diagnosis not present

## 2019-05-07 DIAGNOSIS — T50905A Adverse effect of unspecified drugs, medicaments and biological substances, initial encounter: Secondary | ICD-10-CM

## 2019-05-07 DIAGNOSIS — I10 Essential (primary) hypertension: Secondary | ICD-10-CM

## 2019-05-07 MED ORDER — METOPROLOL TARTRATE 25 MG PO TABS: 25.0000 mg | ORAL_TABLET | Freq: Two times a day (BID) | ORAL | 0 refills | Status: DC

## 2019-05-07 MED ORDER — FLUOXETINE HCL 20 MG PO TABS: 20.0000 mg | ORAL_TABLET | Freq: Every day | ORAL | 0 refills | Status: DC

## 2019-05-07 MED ORDER — ATORVASTATIN CALCIUM 20 MG PO TABS: 20.0000 mg | ORAL_TABLET | Freq: Every day | ORAL | 0 refills | Status: DC

## 2019-05-07 NOTE — Progress Notes (Addendum)
HPI: 73 y/o WF being seen today for concern of her medication making her jaws hurt. Last f/u visit 12/01/2018 I kept her on lisinopril 5mg  qd dosing b/c bp was controlled pretty well.  No other bp med is listed in past med list in EMR.  She has stopped her lisinopril and she has decreased her dose of simvastatin back to 20mg  qd.  The following is an exerpt from my note for her office visit dated 12/01/2018:  "Hyperlipidemia: about 3 mo ago her lipids were a bit elevated on zocor 20 mg so I increased her dose to 40mg  qd. Mild increase in tension of jaw muscles noted, but this is tolerable and improving." In review of past meds in EMR, no other statin is present.  She has a general negative outlook on taking medications. When she stopped her lisinopril her lower jaw pain stopped.  Still with dull pain in upper jaw region. Home bp's had been 130s-140s 80s on the med.  170/90 at GYN visit yesterday. She says she had orthostatic dizziness was occurring on 40mg  simvastatin.    She has chronic generalized anxiety w/out panic.  Takes lorazepam prn and it helps but she has to take another when it wears off.  Lots of extra stress lately, mainly family health problems + covid fears. Citalopram in the past has caused intolerable sweating/flashes of heat/hyperhidrosis so we stopped it and changed her over to prn lorazepam at that time.  ROS: bluish and cold/pain feeling in fingers for 10 min in mildly cooler environments. She showed me a picture today of this and it does show cyanosis scattered throughout fingers.  It does happen in toes as well. Denies depression.  Past Medical History:  Diagnosis Date  . Allergic rhinitis   . Basal cell carcinoma    X 3: one on each leg and one on right shoulder  . Chronic renal insufficiency, stage II (mild)    GFR 60s  . Family history of colon cancer in father    Age 60.  Next colonoscopy due 04/2022.  Marland Kitchen Generalized anxiety disorder   . History of adenomatous  polyp of colon 2008; 2013; 04/2017   2018--Recall 5 yrs (Dr. Megan Salon w/[facility])  . Hyperlipidemia, mixed   . Hypertension    took meds x 69mo.  Came off meds and bp ok.  . Mitral valve prolapse   . Osteoarthritis    Fingers and hips.  . Osteopenia     Past Surgical History:  Procedure Laterality Date  . ABDOMINAL HYSTERECTOMY  1979   Done for DUB.  Ovaries are still in.  No further paps necessary.  . APPENDECTOMY  1979  . CARDIOVASCULAR STRESS TEST  2007   Normal stress echo (in [place])  . CATARACT EXTRACTION  07/2013  . COLONOSCOPY  12/2011   Recall 5 yrs  . COLONOSCOPY  04/21/2017   Tubular adenoma x 1. Repeat in 5 years (Dr. [facility])  . DEXA  02/2017   T-score -2.0.  Repeat 2 yrs (Dr. Orvan Se[facility], [facility]).    Family History  Problem Relation Age of Onset  . Breast cancer Mother        dx'd age 17  . Heart attack Mother   . Heart disease Mother   . Stroke Father   . Hypertension Father   . Cancer Father   . Hypertension Sister   . Cancer Brother   . Hypertension Brother   . Heart attack Brother   . Pancreatitis  Brother   . Hypertension Sister   . Myasthenia gravis Sister      Current Outpatient Medications:  .  fluticasone (FLONASE) 50 MCG/ACT nasal spray, Place 2 sprays into both nostrils daily., Disp: 16 g, Rfl: 6 .  LORazepam (ATIVAN) 0.5 MG tablet, 1-2 tabs po bid prn anxiety, Disp: 60 tablet, Rfl: 1 .  simvastatin (ZOCOR) 40 MG tablet, Take 1 tablet (40 mg total) by mouth at bedtime. (Patient taking differently: Take 40 mg by mouth at bedtime. Pt takes 1/2 tab), Disp: 90 tablet, Rfl: 1 .  lisinopril (PRINIVIL,ZESTRIL) 5 MG tablet, Take 1 tablet (5 mg total) by mouth daily. (Patient not taking: Reported on 05/07/2019), Disp: 901 tablet, Rfl: 1 .  meclizine (ANTIVERT) 25 MG tablet, Take 1 tablet (25 mg total) by mouth 3 (three) times daily as needed for dizziness. (Patient not taking: Reported on 05/07/2019), Disp: 30 tablet, Rfl: 0  EXAM:  VITALS per  patient if applicable: BP (!) 329/51 (BP Location: Left Arm, Patient Position: Sitting, Cuff Size: Normal)   Pulse 65   Temp 98.3 F (36.8 C) (Temporal)   Resp 16   Ht 5\' 2"  (1.575 m)   Wt 141 lb (64 kg)   SpO2 100%   BMI 25.79 kg/m   Gen: Alert, well appearing.  Patient is oriented to person, place, time, and situation. AFFECT: pleasant, lucid thought and speech. No LE swelling.  No cyanosis of fingers or toes.  LABS: none today    Chemistry      Component Value Date/Time   NA 140 09/29/2018 0949   K 4.1 09/29/2018 0949   CL 104 09/29/2018 0949   CO2 29 09/29/2018 0949   BUN 12 09/29/2018 0949   CREATININE 0.79 09/29/2018 0949   CREATININE 0.83 09/15/2018 0911      Component Value Date/Time   CALCIUM 9.4 09/29/2018 0949   ALKPHOS 77 02/03/2017 0958   AST 16 11/12/2017 1542   ALT 12 11/12/2017 1542   BILITOT 0.5 11/12/2017 1542     Lab Results  Component Value Date   CHOL 158 12/01/2018   HDL 47 (L) 12/01/2018   LDLCALC 90 12/01/2018   TRIG 112 12/01/2018   CHOLHDL 3.4 12/01/2018    ASSESSMENT AND PLAN:  Discussed the following assessment and plan:  1) Med side effects: simvastatin (orthostatic dizziness) and lisinopril (jaw pain). See below.  2) HTN: intol lisinopril.  Start amlodipine for HTN and hopefully this will help her Raynaud's as well.  3) Hypercholesterolemia. Her last lipid panel was excellent on the 40mg  simvastatin dosing, but she can't tolerate this dose. Well change simva to atorva 20 mg qd.  4) GAD: Continue prn lorazepam, start fluoxetine 20mg , 1/2 tab qd x 15d, increase to whole tab qd at that time if tolerating it.  Check FLP and CMET in 1 mo.  F/u: 1 mo  ADDENDUM 05/10/19: Pt called to clarify rx's.  I had accidentally sent in metoprolol instead of amlodipine. She did not take the med, but was worried b/c the package insert warned against using it in a person with Raynaud's dz.  I told her that it was a prescribing error on my part  and she should throw the metoprolol away. I just sent in the amlodipine 10mg  qd, the originally intended med. Also, her jaw started hurting again after getting back on statin (I changed her from simva to atorva) so I advised her to d/c atorva and see if her jaw pains go away over  the next couple of weeks.  Signed:  Crissie Sickles, MD           05/10/2019   Signed:  Crissie Sickles, MD           05/07/2019

## 2019-05-10 ENCOUNTER — Other Ambulatory Visit: Payer: Self-pay | Admitting: Family Medicine

## 2019-05-10 ENCOUNTER — Encounter: Payer: Self-pay | Admitting: Family Medicine

## 2019-05-10 MED ORDER — AMLODIPINE BESYLATE 10 MG PO TABS: 10.0000 mg | ORAL_TABLET | Freq: Every day | ORAL | 1 refills | Status: DC

## 2019-05-13 DIAGNOSIS — R69 Illness, unspecified: Secondary | ICD-10-CM | POA: Diagnosis not present

## 2019-05-23 ENCOUNTER — Encounter: Payer: Self-pay | Admitting: Family Medicine

## 2019-05-24 MED ORDER — HYDROCHLOROTHIAZIDE 25 MG PO TABS: 25.0000 mg | ORAL_TABLET | Freq: Every day | ORAL | 0 refills | Status: DC

## 2019-05-24 NOTE — Telephone Encounter (Signed)
Hi Kathy Richardson, Stop amlodipine.  The swelling is a side effect.  Not dangerous but go ahead and stop the med and I want you to start a new bp med called hctz-->I have already sent it to your pharmacy. Yes, taking lorazepam daily is fine so keep taking it every day. Atorvastatin (statins, in general) are definitely the cause of your jaw pains.  Stay off this med and we'll regroup at your next appt 06/04/19. Monitor bp and hr once daily 'til then pls. -PM

## 2019-05-29 ENCOUNTER — Other Ambulatory Visit: Payer: Self-pay | Admitting: Family Medicine

## 2019-05-31 NOTE — Telephone Encounter (Signed)
LMOM for pt to CB to discuss if she needs RX at this time or if she can wait to discuss at next [place] 06/04/2019.

## 2019-06-01 ENCOUNTER — Encounter: Payer: Self-pay | Admitting: Family Medicine

## 2019-06-01 NOTE — Telephone Encounter (Signed)
Pt sent MyChart message stating that she does not take this medication and does not need it filled.

## 2019-06-03 ENCOUNTER — Other Ambulatory Visit: Payer: Self-pay | Admitting: Family Medicine

## 2019-06-04 ENCOUNTER — Ambulatory Visit (INDEPENDENT_AMBULATORY_CARE_PROVIDER_SITE_OTHER): Payer: Medicare HMO | Admitting: Family Medicine

## 2019-06-04 ENCOUNTER — Other Ambulatory Visit: Payer: Self-pay

## 2019-06-04 ENCOUNTER — Encounter: Payer: Self-pay | Admitting: Family Medicine

## 2019-06-04 VITALS — BP 138/82 | HR 66 | Temp 97.5°F | Resp 17 | Ht 62.0 in | Wt 141.4 lb

## 2019-06-04 DIAGNOSIS — F411 Generalized anxiety disorder: Secondary | ICD-10-CM | POA: Diagnosis not present

## 2019-06-04 DIAGNOSIS — R6884 Jaw pain: Secondary | ICD-10-CM | POA: Diagnosis not present

## 2019-06-04 DIAGNOSIS — I10 Essential (primary) hypertension: Secondary | ICD-10-CM | POA: Diagnosis not present

## 2019-06-04 DIAGNOSIS — R69 Illness, unspecified: Secondary | ICD-10-CM | POA: Diagnosis not present

## 2019-06-04 DIAGNOSIS — F458 Other somatoform disorders: Secondary | ICD-10-CM | POA: Diagnosis not present

## 2019-06-04 MED ORDER — PAROXETINE HCL 20 MG PO TABS: 20.0000 mg | ORAL_TABLET | Freq: Every day | ORAL | 0 refills | Status: DC

## 2019-06-04 MED ORDER — HYDROCODONE-ACETAMINOPHEN 5-325 MG PO TABS: ORAL_TABLET | ORAL | 0 refills | Status: DC

## 2019-06-04 MED ORDER — IRBESARTAN 75 MG PO TABS: ORAL_TABLET | ORAL | 0 refills | Status: DC

## 2019-06-04 MED ORDER — HYDROCHLOROTHIAZIDE 25 MG PO TABS: 25.0000 mg | ORAL_TABLET | Freq: Every day | ORAL | 6 refills | Status: DC

## 2019-06-04 NOTE — Progress Notes (Signed)
OFFICE VISIT  06/04/2019   CC:  Chief Complaint  Patient presents with  . Hypertension    checks bp twice a day. took med today. pt brought record of bps from 7/15-7/  . Anxiety    pt would like to change medication. she said that it isnt working for her   . Jaw Pain    x6 months. 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 she said it is more of an ache. she would like something stronger than advil./   HPI:    Patient is a 73 y.o. Caucasian female who presents for 1 mo f/u HTN, HLD, anxiety. We've most recently switched her to hctz 25mg  qd (2 wks ago) b/c of swelling from amlodipine (and cough from ACE-I). Also, she is off statin b/c of persistent jaw muscle pain when taking simva and atorva. She has not tolerated cital or fluox for her anxiety, so we've just most recently decided to stick with a daily dose of her benzo for anxiety control.  Interim hx: Tolerating hctz, bp's avg 140s over 80s.  No increase in urination amount/frequency noted by pt.  Jaw: the pain is still a problem, even off of the statin.  She went to dentist and got a mouth guard--has had it 2 wks.  She had actually loosened some of her teeth from all the clenching she has been doing (from stress/anxiety).  Teeth throbbing feel better since getting bite guard.  Still has pain in both jaw regions constantly, worse at night.  OTC meds no help. She is quite miserable much of the time from this.  Anxiety:  fluox made her had tremulousness.   She has tried taking ativan tid and this caused her too much sedative effect and didn't help the chronic worry much at all.   She worries about everything.  She is irritable, can't concentrate, gets emotional easily, feels keyed up, is restless. Lexapro and paxil were tolerated well in the past and they worked.  Cital->hyperhydrosis.  ROS: no CP, no SOB, no wheezing, no cough, no dizziness, no HAs, no rashes, no melena/hematochezia.  No polyuria or polydipsia.  No myalgias or  arthralgias.   Past Medical History:  Diagnosis Date  . Allergic rhinitis   . Basal cell carcinoma    X 3: one on each leg and one on right shoulder  . Chronic renal insufficiency, stage II (mild)    GFR 60s  . Family history of colon cancer in father    Age 62.  Next colonoscopy due 04/2022.  Marland Kitchen Generalized anxiety disorder   . History of adenomatous polyp of colon 2008; 2013; 04/2017   2018--Recall 5 yrs (Dr. Megan Salon w/[facility])  . Hyperlipidemia, mixed   . Hypertension    took meds x 43mo.  Came off meds and bp ok.  . Mitral valve prolapse   . Osteoarthritis    Fingers and hips.  . Osteopenia     Past Surgical History:  Procedure Laterality Date  . ABDOMINAL HYSTERECTOMY  1979   Done for DUB.  Ovaries are still in.  No further paps necessary.  . APPENDECTOMY  1979  . CARDIOVASCULAR STRESS TEST  2007   Normal stress echo (in [place])  . CATARACT EXTRACTION  07/2013  . COLONOSCOPY  12/2011   Recall 5 yrs  . COLONOSCOPY  04/21/2017   Tubular adenoma x 1. Repeat in 5 years (Dr. [facility])  . DEXA  02/2017   T-score -2.0.  Repeat 2  yrs (Dr. [facility], [facility]).    Outpatient Medications Prior to Visit  Medication Sig Dispense Refill  . fluticasone (FLONASE) 50 MCG/ACT nasal spray Place 2 sprays into both nostrils daily. 16 g 6  . LORazepam (ATIVAN) 0.5 MG tablet 1-2 tabs po bid prn anxiety 60 tablet 1  . meclizine (ANTIVERT) 25 MG tablet Take 1 tablet (25 mg total) by mouth 3 (three) times daily as needed for dizziness. 30 tablet 0  . hydrochlorothiazide (HYDRODIURIL) 25 MG tablet Take 1 tablet (25 mg total) by mouth daily. 30 tablet 0  . amLODipine (NORVASC) 10 MG tablet Take 1 tablet (10 mg total) by mouth daily. 30 tablet 1  . atorvastatin (LIPITOR) 20 MG tablet Take 1 tablet (20 mg total) by mouth daily. 30 tablet 0  . FLUoxetine (PROZAC) 20 MG tablet Take 1 tablet (20 mg total) by mouth daily. 30 tablet 0   No facility-administered medications prior to visit.      Allergies  Allergen Reactions  . Citalopram Other (See Comments)    Heatwaves/hot flashes  . Lisinopril Other (See Comments)    Jaw pain  . Simvastatin Other (See Comments)    Orthostatic dizziness    ROS As per HPI  PE: Blood pressure 138/82, pulse 66, temperature (!) 97.5 F (36.4 C), temperature source Temporal, resp. rate 17, height 5\' 2"  (1.575 m), weight 141 lb 6 oz (64.1 kg), SpO2 99 %. Body mass index is 25.86 kg/m.  Gen: Alert, well appearing.  Patient is oriented to person, place, time, and situation. AFFECT: pleasant, lucid thought and speech. No further exam today.  LABS:    Chemistry      Component Value Date/Time   NA 140 09/29/2018 0949   K 4.1 09/29/2018 0949   CL 104 09/29/2018 0949   CO2 29 09/29/2018 0949   BUN 12 09/29/2018 0949   CREATININE 0.79 09/29/2018 0949   CREATININE 0.83 09/15/2018 0911      Component Value Date/Time   CALCIUM 9.4 09/29/2018 0949   ALKPHOS 77 02/03/2017 0958   AST 16 11/12/2017 1542   ALT 12 11/12/2017 1542   BILITOT 0.5 11/12/2017 1542      IMPRESSION AND PLAN:  1) HTN: hctz has not helped that much, so we'll add irbesartan 75mg  qd to it.  I gave instructions for her to increase to TWO of the 75mg  tabs daily if bp not at <130/80 consistently in 1 wk. BMET at f/u in 3 wks.  2) Jaw pain: TMJ joint/ligament and muscles of mastication are all strained constantly.  Teeth better with bite guard. Hopefully the longer she is on the bite guard and as we get her anxiety controlled her jaw pain will get better. In the meantime, I rx'd vicodin 5/325, 1-2 bid prn pain.    3) GAD: she is really having a hard time, esp dealing with her fear surrounding the covid 19 pandemic and the social problems going on right now with protesting, etc. Start paxil 20mg  qd since she says she tolerated this med in the past AND it worked in the past. Therapeutic expectations and side effect profile of medication discussed today.  Patient's  questions answered. Will see her back in 3 wks and likely increase to 30 or 40 mg qd at that time. She'll continue lorazepam prn.  4) Hypercholesterolemia: we were blaming statins for her jaw pains but since this is not the case after all we'll likely get her back on a statin once her  other issues have calmed down sufficiently.  An After Visit Summary was printed and given to the patient.  FOLLOW UP: Return in about 3 weeks (around 06/25/2019) for f/u anx/htn/jaw.  Signed:  Crissie Sickles, MD           06/04/2019

## 2019-06-07 ENCOUNTER — Encounter: Payer: Self-pay | Admitting: Family Medicine

## 2019-06-08 ENCOUNTER — Encounter: Payer: Self-pay | Admitting: Family Medicine

## 2019-06-09 ENCOUNTER — Encounter: Payer: Self-pay | Admitting: Family Medicine

## 2019-06-23 ENCOUNTER — Ambulatory Visit (INDEPENDENT_AMBULATORY_CARE_PROVIDER_SITE_OTHER): Payer: Medicare HMO | Admitting: Family Medicine

## 2019-06-23 ENCOUNTER — Encounter: Payer: Self-pay | Admitting: Family Medicine

## 2019-06-23 ENCOUNTER — Other Ambulatory Visit: Payer: Self-pay

## 2019-06-23 VITALS — BP 135/82 | HR 57 | Temp 98.5°F | Resp 16 | Ht 62.0 in | Wt 140.0 lb

## 2019-06-23 DIAGNOSIS — I10 Essential (primary) hypertension: Secondary | ICD-10-CM | POA: Diagnosis not present

## 2019-06-23 DIAGNOSIS — F411 Generalized anxiety disorder: Secondary | ICD-10-CM

## 2019-06-23 DIAGNOSIS — R69 Illness, unspecified: Secondary | ICD-10-CM | POA: Diagnosis not present

## 2019-06-23 NOTE — Progress Notes (Signed)
OFFICE VISIT  06/23/2019   CC:  Chief Complaint  Patient presents with  . Follow-up    anxiety/hypertension/jaw    HPI:    Patient is a 73 y.o. Caucasian female who presents for 3 week f/u uncontrolled HTN and anxiety. I added irbesartan 75mg  qd last visit and also started her on paxil 20mg  qd. She has chronic jaw/TMJ pain and I rx'd vicodin last visit to use prn, esp hs so she can rest/sleep.  She was having some tremulousness and sweaty palms so we decreased her paxil to 1/2 20mg  tab to see if this helped. Also, bp's initially low normal and caused signif fatigue so I recommended cutting the irbesartan to 1/2 of 75mg  tab qd.  Interim hx: Feeling MUCH better.  She has remained on the 10mg  paxil dosing. Less anxiety/worry, coping well with things when they do get worse for brief periods. Sleep is great now. No depression.  No manic/hypomanic sx's.  No more tremulousness or sweating, but she does says she feels some "internal tremulousness". She had a good trip out of town to family in the [place] area since I last saw her.  Home bp's reviewed: avg 125/70, HR avg 62. Still taking 1/2 of 75mg  irbesartan and hctz 25mg  qd.   Past Medical History:  Diagnosis Date  . Allergic rhinitis   . Basal cell carcinoma    X 3: one on each leg and one on right shoulder  . Chronic renal insufficiency, stage II (mild)    GFR 60s  . Family history of colon cancer in father    Age 66.  Next colonoscopy due 04/2022.  Marland Kitchen Generalized anxiety disorder   . History of adenomatous polyp of colon 2008; 2013; 04/2017   2018--Recall 5 yrs (Dr. Megan Salon w/[facility])  . Hyperlipidemia, mixed   . Hypertension    took meds x 78mo.  Came off meds and bp ok.  . Mitral valve prolapse   . Osteoarthritis    Fingers and hips.  . Osteopenia     Past Surgical History:  Procedure Laterality Date  . ABDOMINAL HYSTERECTOMY  1979   Done for DUB.  Ovaries are still in.  No further paps necessary.  . APPENDECTOMY   1979  . CARDIOVASCULAR STRESS TEST  2007   Normal stress echo (in [place])  . CATARACT EXTRACTION  07/2013  . COLONOSCOPY  12/2011   Recall 5 yrs  . COLONOSCOPY  04/21/2017   Tubular adenoma x 1. Repeat in 5 years (Dr. [facility])  . DEXA  02/2017   T-score -2.0.  Repeat 2 yrs (Dr. [facility], [facility]).    Outpatient Medications Prior to Visit  Medication Sig Dispense Refill  . fluticasone (FLONASE) 50 MCG/ACT nasal spray Place 2 sprays into both nostrils daily. 16 g 6  . hydrochlorothiazide (HYDRODIURIL) 25 MG tablet Take 1 tablet (25 mg total) by mouth daily. 30 tablet 6  . HYDROcodone-acetaminophen (NORCO/VICODIN) 5-325 MG tablet 1-2 tabs po bid prn jaw pain 60 tablet 0  . irbesartan (AVAPRO) 75 MG tablet 1-2 tabs po qd 60 tablet 0  . LORazepam (ATIVAN) 0.5 MG tablet 1-2 tabs po bid prn anxiety 60 tablet 1  . meclizine (ANTIVERT) 25 MG tablet Take 1 tablet (25 mg total) by mouth 3 (three) times daily as needed for dizziness. 30 tablet 0  . PARoxetine (PAXIL) 20 MG tablet Take 1 tablet (20 mg total) by mouth daily. 30 tablet 0   No facility-administered medications prior to visit.  Allergies  Allergen Reactions  . Citalopram Other (See Comments)    Heatwaves/hot flashes  . Lisinopril Other (See Comments)    Jaw pain  . Simvastatin Other (See Comments)    Orthostatic dizziness    ROS As per HPI  PE: Blood pressure 135/82, pulse (!) 57, temperature 98.5 F (36.9 C), temperature source Temporal, resp. rate 16, height 5\' 2"  (1.575 m), weight 140 lb (63.5 kg), SpO2 98 %. Gen: Alert, well appearing.  Patient is oriented to person, place, time, and situation. AFFECT: pleasant, lucid thought and speech. No further exam today.  LABS:    Chemistry      Component Value Date/Time   NA 140 09/29/2018 0949   K 4.1 09/29/2018 0949   CL 104 09/29/2018 0949   CO2 29 09/29/2018 0949   BUN 12 09/29/2018 0949   CREATININE 0.79 09/29/2018 0949   CREATININE 0.83 09/15/2018  0911      Component Value Date/Time   CALCIUM 9.4 09/29/2018 0949   ALKPHOS 77 02/03/2017 0958   AST 16 11/12/2017 1542   ALT 12 11/12/2017 1542   BILITOT 0.5 11/12/2017 1542       IMPRESSION AND PLAN:  1) GAD: much improved on paxil 10mg  qd for approx 3 wks. Continue current dose, see how her feeling of Dia Crawford goes over the next month.  2) HTN: The current medical regimen is effective;  continue present plan and medications.  An After Visit Summary was printed and given to the patient.  Spent 15 min with pt today, with >50% of this time spent in counseling and care coordination regarding the above problems.   FOLLOW UP: Return in about 4 weeks (around 07/21/2019) for routine chronic illness f/u.  Signed:  Crissie Sickles, MD           06/23/2019

## 2019-06-24 ENCOUNTER — Ambulatory Visit: Payer: Medicare HMO | Admitting: Family Medicine

## 2019-06-26 ENCOUNTER — Other Ambulatory Visit: Payer: Self-pay | Admitting: Family Medicine

## 2019-07-29 DIAGNOSIS — R69 Illness, unspecified: Secondary | ICD-10-CM | POA: Diagnosis not present

## 2019-07-30 ENCOUNTER — Ambulatory Visit (INDEPENDENT_AMBULATORY_CARE_PROVIDER_SITE_OTHER): Payer: Medicare HMO | Admitting: Family Medicine

## 2019-07-30 ENCOUNTER — Other Ambulatory Visit: Payer: Self-pay

## 2019-07-30 ENCOUNTER — Encounter: Payer: Self-pay | Admitting: Family Medicine

## 2019-07-30 VITALS — BP 131/74 | HR 63 | Temp 97.7°F | Resp 16 | Ht 62.0 in | Wt 139.1 lb

## 2019-07-30 DIAGNOSIS — I341 Nonrheumatic mitral (valve) prolapse: Secondary | ICD-10-CM | POA: Diagnosis not present

## 2019-07-30 DIAGNOSIS — E7800 Pure hypercholesterolemia, unspecified: Secondary | ICD-10-CM | POA: Diagnosis not present

## 2019-07-30 DIAGNOSIS — Z23 Encounter for immunization: Secondary | ICD-10-CM | POA: Diagnosis not present

## 2019-07-30 DIAGNOSIS — I10 Essential (primary) hypertension: Secondary | ICD-10-CM

## 2019-07-30 DIAGNOSIS — R002 Palpitations: Secondary | ICD-10-CM

## 2019-07-30 NOTE — Progress Notes (Addendum)
OFFICE VISIT  07/30/2019   CC:  Chief Complaint  Patient presents with  . Follow-up    BP and anxiety.   . irregular heartbeat    x 1 year. becoming more frequest. she thought maybe its from her anxiety. she feels lightheaded for just a second.    HPI:    Patient is a 73 y.o. Caucasian female who presents for 5 week f/u HTN and GAD. Well controlled problems at last visit but was having some mild akithesia side effect from the 10mg  qd dose of paxil and we were going to give this time to see if it gradually subsided. We kept her on 1/2 tab of irbesartan 75 mg as well as hctz 25mg  qd.  Interim hx:  BP excellent. Jaw pain resolved. Anxiety very well controlled.  Akithesia resolved.  Occ takes ativan (about once a week).  Notes about 1 yr hx of "palpitations". She walks 1 hour about 5 times a week, up and down hills.  Pulse resting is 60, after a block her HR goes up to 120s, feels it beating fast.  No dizziness, no CP, no unusual SOB. Usually rests briefly and sx's resolve and she resumes walking.  Recently she has had to stop and not resume walking. Also, notes sometimes at rest in her home she feels a few consecutive slow and firm beats.  FH: she is worried about this. Mother: MI, CHF. Brother: CAD, CABG age 22, LBBB Sister RBBB.  In the remote past when she had similar sx's (2007), a full w/u was done and it revealed some premature ventricular contractions and MVP w/out regurg.  Past Medical History:  Diagnosis Date  . Allergic rhinitis   . Basal cell carcinoma    X 3: one on each leg and one on right shoulder  . Chronic renal insufficiency, stage II (mild)    GFR 60s  . Family history of colon cancer in father    Age 63.  Next colonoscopy due 04/2022.  Marland Kitchen Generalized anxiety disorder   . History of adenomatous polyp of colon 2008; 2013; 04/2017   2018--Recall 5 yrs (Dr. Megan Salon w/[facility])  . Hyperlipidemia, mixed   . Hypertension    took meds x 23mo.  Came off meds and bp  ok.  . Mitral valve prolapse   . Osteoarthritis    Fingers and hips.  . Osteopenia     Past Surgical History:  Procedure Laterality Date  . ABDOMINAL HYSTERECTOMY  1979   Done for DUB.  Ovaries are still in.  No further paps necessary.  . APPENDECTOMY  1979  . CARDIOVASCULAR STRESS TEST  2007   Normal stress echo (in [place])  . CATARACT EXTRACTION  07/2013  . COLONOSCOPY  12/2011   Recall 5 yrs  . COLONOSCOPY  04/21/2017   Tubular adenoma x 1. Repeat in 5 years (Dr. [facility])  . DEXA  02/2017   T-score -2.0.  Repeat 2 yrs (Dr. Or[facility], [facility]).    Outpatient Medications Prior to Visit  Medication Sig Dispense Refill  . fluticasone (FLONASE) 50 MCG/ACT nasal spray Place 2 sprays into both nostrils daily. 16 g 6  . hydrochlorothiazide (HYDRODIURIL) 25 MG tablet Take 1 tablet (25 mg total) by mouth daily. 30 tablet 6  . irbesartan (AVAPRO) 75 MG tablet TAKE 1 TO 2 TABLETS BY MOUTH EVERY DAY (Patient taking differently: Take 1/2 tab daily) 30 tablet 0  . LORazepam (ATIVAN) 0.5 MG tablet 1-2 tabs po bid prn  anxiety 60 tablet 1  . meclizine (ANTIVERT) 25 MG tablet Take 1 tablet (25 mg total) by mouth 3 (three) times daily as needed for dizziness. 30 tablet 0  . PARoxetine (PAXIL) 20 MG tablet TAKE 1 TABLET BY MOUTH EVERY DAY (Patient taking differently: Taking 1/2 daily) 90 tablet 1  . HYDROcodone-acetaminophen (NORCO/VICODIN) 5-325 MG tablet 1-2 tabs po bid prn jaw pain (Patient not taking: Reported on 07/30/2019) 60 tablet 0   No facility-administered medications prior to visit.     Allergies  Allergen Reactions  . Citalopram Other (See Comments)    Heatwaves/hot flashes  . Lisinopril Other (See Comments)    Jaw pain  . Simvastatin Other (See Comments)    Orthostatic dizziness    ROS As per HPI  PE: Blood pressure 131/74, pulse 63, temperature 97.7 F (36.5 C), temperature source Temporal, resp. rate 16, height 5\' 2"  (1.575 m), weight 139 lb 2 oz (63.1 kg),  SpO2 99 %. Body mass index is 25.45 kg/m.  Gen: Alert, well appearing.  Patient is oriented to person, place, time, and situation. AFFECT: pleasant, lucid thought and speech. [id number]: no injection, icteris, swelling, or exudate.  EOMI, PERRLA. Mouth: lips without lesion/swelling.  Oral mucosa pink and moist. Oropharynx without erythema, exudate, or swelling.  Neck - No masses or thyromegaly or limitation in range of motion CV: RRR, no m/r/g.   LUNGS: CTA bilat, nonlabored resps, good aeration in all lung fields. EXT: no clubbing or cyanosis.  no edema.    LABS:    Chemistry      Component Value Date/Time   NA 140 09/29/2018 0949   K 4.1 09/29/2018 0949   CL 104 09/29/2018 0949   CO2 29 09/29/2018 0949   BUN 12 09/29/2018 0949   CREATININE 0.79 09/29/2018 0949   CREATININE 0.83 09/15/2018 0911      Component Value Date/Time   CALCIUM 9.4 09/29/2018 0949   ALKPHOS 77 02/03/2017 0958   AST 16 11/12/2017 1542   ALT 12 11/12/2017 1542   BILITOT 0.5 11/12/2017 1542     Lab Results  Component Value Date   TSH 3.56 11/23/2014   12 lead EKG today (no prior EKG available for comparison): NSR, rate 62, no ectopy.  Intervals and duration normal.  No ischemic changes.  RSR' V1 and V2 nondiagnostic, TWI in V1 and V2. No hypertrophy.  Low voltage precordial leads.  IMPRESSION AND PLAN:  1) HTN: excellent control.  No changes in management. BMET today.  2) GAD: The current medical regimen is effective;  continue present plan and medications. Takes ativan RARELY.  CSC UTD.  3) TMJ pain: resolved!  4) Palpitations: remote hx(2007), with extensive w/u that was unrevealing. Has occurred infrequently since then, until the last 1 yr and it is getting to be more of a problem now as time goes on. BMET, mag,  and TSH. 48H Holter ordered. Echocardiogram ordered. May ultimately need cardiology referral.  An After Visit Summary was printed and given to the patient.  FOLLOW UP: Return  in about 2 months (around 09/29/2019) for annual CPE (fasting).  Signed:  Crissie Sickles, MD           07/30/2019

## 2019-07-31 LAB — BASIC METABOLIC PANEL
BUN: 13 mg/dL (ref 7–25)
CO2: 28 mmol/L (ref 20–32)
Calcium: 9.5 mg/dL (ref 8.6–10.4)
Chloride: 95 mmol/L — ABNORMAL LOW (ref 98–110)
Creat: 0.8 mg/dL (ref 0.60–0.93)
Glucose, Bld: 87 mg/dL (ref 65–99)
Potassium: 4.4 mmol/L (ref 3.5–5.3)
Sodium: 133 mmol/L — ABNORMAL LOW (ref 135–146)

## 2019-07-31 LAB — MAGNESIUM: Magnesium: 2 mg/dL (ref 1.5–2.5)

## 2019-07-31 LAB — LIPID PANEL
Cholesterol: 271 mg/dL — ABNORMAL HIGH (ref <200)
HDL: 40 mg/dL — ABNORMAL LOW (ref >=50)
LDL Cholesterol (Calc): 195 mg/dL (calc) — ABNORMAL HIGH
Non-HDL Cholesterol (Calc): 231 mg/dL (calc) — ABNORMAL HIGH (ref <130)
Total CHOL/HDL Ratio: 6.8 (calc) — ABNORMAL HIGH (ref <5.0)
Triglycerides: 185 mg/dL — ABNORMAL HIGH (ref <150)

## 2019-07-31 LAB — TSH: TSH: 1.84 mIU/L (ref 0.40–4.50)

## 2019-07-31 LAB — EXTRA LAV TOP TUBE

## 2019-08-01 ENCOUNTER — Encounter: Payer: Self-pay | Admitting: Family Medicine

## 2019-08-02 ENCOUNTER — Other Ambulatory Visit: Payer: Self-pay

## 2019-08-02 MED ORDER — ATORVASTATIN CALCIUM 10 MG PO TABS: 10.0000 mg | ORAL_TABLET | ORAL | 2 refills | Status: DC

## 2019-08-04 ENCOUNTER — Telehealth: Payer: Self-pay | Admitting: *Deleted

## 2019-08-04 NOTE — Telephone Encounter (Signed)
3 day ZIO XT long term holter monitor to be mailed to the patients home in lieu of a 48 hour holter monitor during COVID restrictions. Instructions reviewed briefly as they are included in the monitor kit.

## 2019-08-05 ENCOUNTER — Other Ambulatory Visit: Payer: Self-pay

## 2019-08-05 ENCOUNTER — Ambulatory Visit (HOSPITAL_COMMUNITY): Payer: Medicare HMO | Attending: Internal Medicine

## 2019-08-05 DIAGNOSIS — R002 Palpitations: Secondary | ICD-10-CM | POA: Insufficient documentation

## 2019-08-05 DIAGNOSIS — I341 Nonrheumatic mitral (valve) prolapse: Secondary | ICD-10-CM | POA: Diagnosis not present

## 2019-08-05 HISTORY — PX: TRANSTHORACIC ECHOCARDIOGRAM: SHX275

## 2019-08-09 ENCOUNTER — Encounter: Payer: Self-pay | Admitting: Family Medicine

## 2019-08-11 ENCOUNTER — Ambulatory Visit (INDEPENDENT_AMBULATORY_CARE_PROVIDER_SITE_OTHER): Payer: Medicare HMO

## 2019-08-11 DIAGNOSIS — R002 Palpitations: Secondary | ICD-10-CM

## 2019-08-19 ENCOUNTER — Other Ambulatory Visit: Payer: Self-pay | Admitting: Family Medicine

## 2019-08-23 DIAGNOSIS — R002 Palpitations: Secondary | ICD-10-CM | POA: Diagnosis not present

## 2019-08-26 ENCOUNTER — Encounter: Payer: Self-pay | Admitting: Family Medicine

## 2019-08-26 ENCOUNTER — Other Ambulatory Visit: Payer: Self-pay | Admitting: Family Medicine

## 2019-08-26 DIAGNOSIS — R002 Palpitations: Secondary | ICD-10-CM

## 2019-08-26 HISTORY — PX: OTHER SURGICAL HISTORY: SHX169

## 2019-08-28 ENCOUNTER — Other Ambulatory Visit: Payer: Self-pay | Admitting: Family Medicine

## 2019-08-30 NOTE — Telephone Encounter (Signed)
RF request for lorazepam. Last OV 07/30/2019 Next OV 09/27/2019 Last RX4/24/2020 # 60 x 1RF.  Please advise.

## 2019-09-03 ENCOUNTER — Ambulatory Visit: Payer: Medicare HMO | Admitting: Cardiology

## 2019-09-06 ENCOUNTER — Ambulatory Visit: Payer: Medicare HMO | Admitting: Cardiology

## 2019-09-13 ENCOUNTER — Other Ambulatory Visit: Payer: Self-pay | Admitting: Family Medicine

## 2019-09-20 DIAGNOSIS — R0600 Dyspnea, unspecified: Secondary | ICD-10-CM | POA: Diagnosis not present

## 2019-09-20 DIAGNOSIS — I10 Essential (primary) hypertension: Secondary | ICD-10-CM | POA: Diagnosis not present

## 2019-09-20 DIAGNOSIS — E785 Hyperlipidemia, unspecified: Secondary | ICD-10-CM | POA: Diagnosis not present

## 2019-09-27 ENCOUNTER — Ambulatory Visit (INDEPENDENT_AMBULATORY_CARE_PROVIDER_SITE_OTHER): Payer: Medicare HMO | Admitting: Family Medicine

## 2019-09-27 ENCOUNTER — Encounter: Payer: Self-pay | Admitting: Family Medicine

## 2019-09-27 ENCOUNTER — Other Ambulatory Visit: Payer: Self-pay

## 2019-09-27 ENCOUNTER — Encounter: Payer: Medicare HMO | Admitting: Family Medicine

## 2019-09-27 VITALS — BP 134/83 | HR 67 | Temp 98.4°F | Resp 16 | Ht 62.0 in | Wt 140.8 lb

## 2019-09-27 DIAGNOSIS — Z0000 Encounter for general adult medical examination without abnormal findings: Secondary | ICD-10-CM

## 2019-09-27 DIAGNOSIS — Z23 Encounter for immunization: Secondary | ICD-10-CM

## 2019-09-27 DIAGNOSIS — I10 Essential (primary) hypertension: Secondary | ICD-10-CM | POA: Diagnosis not present

## 2019-09-27 DIAGNOSIS — Z1231 Encounter for screening mammogram for malignant neoplasm of breast: Secondary | ICD-10-CM

## 2019-09-27 DIAGNOSIS — Z1211 Encounter for screening for malignant neoplasm of colon: Secondary | ICD-10-CM | POA: Diagnosis not present

## 2019-09-27 DIAGNOSIS — E7800 Pure hypercholesterolemia, unspecified: Secondary | ICD-10-CM | POA: Diagnosis not present

## 2019-09-27 NOTE — Progress Notes (Signed)
Office Note 09/27/2019  CC:  Chief Complaint  Patient presents with  . Annual Exam    pt is fasting    HPI:  Kathy Richardson is a 73 y.o. White female who is here for annual health maintenance exam.  HLD: plan was to restart atorvastatin 10mg  qod about 2 mo ago. Tolerating this fine.  HTN: taking 1/2 irbesartan 75mg  and hctz 25mg  qd. Home monitoring consistently <130/80.  I referred her to cardiologist 08/2019 for palpitations. She saw Loni Beckwith with [facility] in [place]. He is going to do a stress test 09/22/19.     Past Medical History:  Diagnosis Date  . Allergic rhinitis   . Basal cell carcinoma    X 3: one on each leg and one on right shoulder  . Chronic renal insufficiency, stage II (mild)    GFR 60s  . Family history of colon cancer in father    Age 23.  Next colonoscopy due 04/2022.  Marland Kitchen Generalized anxiety disorder   . History of adenomatous polyp of colon 2008; 2013; 04/2017   2018--Recall 5 yrs (Dr. Megan Salon w/[facility])  . Hyperlipidemia, mixed    simva 2016; atorva 05/2019  . Hypertension   . Mitral valve prolapse   . Osteoarthritis    Fingers and hips.  . Osteopenia   . Palpitations    3d event monitor normal 08/26/19    Past Surgical History:  Procedure Laterality Date  . ABDOMINAL HYSTERECTOMY  1979   Done for DUB.  Ovaries are still in.  No further paps necessary.  . APPENDECTOMY  1979  . CARDIOVASCULAR STRESS TEST  2007   Normal stress echo (in [place])  . CATARACT EXTRACTION  07/2013  . COLONOSCOPY  12/2011   Recall 5 yrs  . COLONOSCOPY  04/21/2017   Tubular adenoma x 1. Repeat in 5 years (Dr. [facility])  . DEXA  02/2017   T-score -2.0.  Repeat 2 yrs (Dr. Orvan Se[facility], [facility]).  . Event monitor  08/26/2019   74 H->no adverse arrhythmias.  Occ PVC and PAT.  Marland Kitchen TRANSTHORACIC ECHOCARDIOGRAM  08/05/2019   EF 65-70%, grd I DD, mild MR.    Family History  Problem Relation Age of Onset  . Breast cancer Mother        dx'd age 44  .  Heart attack Mother   . Heart disease Mother   . Stroke Father   . Hypertension Father   . Cancer Father   . Hypertension Sister   . Cancer Brother   . Hypertension Brother   . Heart attack Brother   . Pancreatitis Brother   . Hypertension Sister   . Myasthenia gravis Sister     Social History   Socioeconomic History  . Marital status: Married    Spouse name: Not on file  . Number of children: Not on file  . Years of education: Not on file  . Highest education level: Not on file  Occupational History  . Not on file  Social Needs  . Financial resource strain: Not on file  . Food insecurity    Worry: Not on file    Inability: Not on file  . Transportation needs    Medical: Not on file    Non-medical: Not on file  Tobacco Use  . Smoking status: Never Smoker  . Smokeless tobacco: Never Used  Substance and Sexual Activity  . Alcohol use: Yes    Alcohol/week: 1.0 standard drinks    Types: 1 Glasses  of wine per week    Comment: 4-5 times weekly  . Drug use: No  . Sexual activity: Not on file  Lifestyle  . Physical activity    Days per week: Not on file    Minutes per session: Not on file  . Stress: Not on file  Relationships  . Social Herbalist on phone: Not on file    Gets together: Not on file    Attends religious service: Not on file    Active member of club or organization: Not on file    Attends meetings of clubs or organizations: Not on file    Relationship status: Not on file  . Intimate partner violence    Fear of current or ex partner: Not on file    Emotionally abused: Not on file    Physically abused: Not on file    Forced sexual activity: Not on file  Other Topics Concern  . Not on file  Social History Narrative   Married, 1 son, 1 grand-daughter.   Orig from [place], then [place] for 30 yrs, relocated back to [place].   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   NO tob.  Alc: 1 glass wine 4-5  times per week.    Outpatient Medications Prior to Visit  Medication Sig Dispense Refill  . atorvastatin (LIPITOR) 10 MG tablet Take 1 tablet (10 mg total) by mouth every other day. 15 tablet 2  . hydrochlorothiazide (HYDRODIURIL) 25 MG tablet Take 1 tablet (25 mg total) by mouth daily. 30 tablet 6  . HYDROcodone-acetaminophen (NORCO/VICODIN) 5-325 MG tablet 1-2 tabs po bid prn jaw pain 60 tablet 0  . irbesartan (AVAPRO) 75 MG tablet TAKE 1 TO 2 TABLETS BY MOUTH EVERY DAY (Patient taking differently: Patient takes 1/2 tab daily.) 30 tablet 0  . LORazepam (ATIVAN) 0.5 MG tablet TAKE 1 TO 2 TABLETS BY MOUTH TWICE A DAY AS NEEDED FOR ANXIETY 60 tablet 1  . meclizine (ANTIVERT) 25 MG tablet Take 1 tablet (25 mg total) by mouth 3 (three) times daily as needed for dizziness. 30 tablet 0  . PARoxetine (PAXIL) 20 MG tablet TAKE 1 TABLET BY MOUTH EVERY DAY (Patient taking differently: Taking 1/2 daily) 90 tablet 1  . fluticasone (FLONASE) 50 MCG/ACT nasal spray Place 2 sprays into both nostrils daily. (Patient not taking: Reported on 09/27/2019) 16 g 6   No facility-administered medications prior to visit.     Allergies  Allergen Reactions  . Citalopram Other (See Comments)    Heatwaves/hot flashes  . Lisinopril Other (See Comments)    Jaw pain  . Simvastatin Other (See Comments)    Orthostatic dizziness    ROS Review of Systems  Constitutional: Negative for appetite change, chills, fatigue and fever.  HENT: Negative for congestion, dental problem, ear pain and sore throat.   Eyes: Negative for discharge, redness and visual disturbance.  Respiratory: Negative for cough, chest tightness, shortness of breath and wheezing.   Cardiovascular: Positive for palpitations (see hpi above). Negative for chest pain and leg swelling.  Gastrointestinal: Negative for abdominal pain, blood in stool, diarrhea, nausea and vomiting.  Genitourinary: Negative for difficulty urinating, dysuria, flank pain,  frequency, hematuria and urgency.  Musculoskeletal: Negative for arthralgias, back pain, joint swelling, myalgias and neck stiffness.  Skin: Negative for pallor and rash.  Neurological: Negative for dizziness, speech difficulty, weakness and headaches.  Hematological: Negative for adenopathy. Does not bruise/bleed easily.  Psychiatric/Behavioral: Negative for confusion and sleep disturbance. The patient is not nervous/anxious.    ' PE; Blood pressure 134/83, pulse 67, temperature 98.4 F (36.9 C), temperature source Temporal, resp. rate 16, height 5\' 2"  (1.575 m), weight 140 lb 12.8 oz (63.9 kg), SpO2 100 %. Body mass index is 25.75 kg/m. Chaperoned by Caroll Rancher, LPN. Gen: Alert, well appearing.  Patient is oriented to person, place, time, and situation. AFFECT: pleasant, lucid thought and speech. ENT: Ears: EACs clear, normal epithelium.  TMs with good light reflex and landmarks bilaterally.  Eyes: no injection, icteris, swelling, or exudate.  EOMI, PERRLA. Nose: no drainage or turbinate edema/swelling.  No injection or focal lesion.  Mouth: lips without lesion/swelling.  Oral mucosa pink and moist.  Dentition intact and without obvious caries or gingival swelling.  Oropharynx without erythema, exudate, or swelling.  Neck: supple/nontender.  No LAD, mass, or TM.  Carotid pulses 2+ bilaterally, without bruits. CV: RRR, no m/r/g.   LUNGS: CTA bilat, nonlabored resps, good aeration in all lung fields. ABD: soft, NT, ND, BS normal.  No hepatospenomegaly or mass.  No bruits. EXT: no clubbing, cyanosis, or edema.  Musculoskeletal: no joint swelling, erythema, warmth, or tenderness.  ROM of all joints intact. Skin - no sores or suspicious lesions or rashes or color changes   Pertinent labs:  Lab Results  Component Value Date   TSH 1.84 07/30/2019   Lab Results  Component Value Date   WBC 6.3 11/12/2017   HGB 12.9 11/12/2017   HCT 38.0 11/12/2017   MCV 87.4 11/12/2017   PLT 216  11/12/2017   Lab Results  Component Value Date   CREATININE 0.80 07/30/2019   BUN 13 07/30/2019   NA 133 (L) 07/30/2019   K 4.4 07/30/2019   CL 95 (L) 07/30/2019   CO2 28 07/30/2019   Lab Results  Component Value Date   ALT 12 11/12/2017   AST 16 11/12/2017   ALKPHOS 77 02/03/2017   BILITOT 0.5 11/12/2017   Lab Results  Component Value Date   CHOL 271 (H) 07/30/2019   Lab Results  Component Value Date   HDL 40 (L) 07/30/2019   Lab Results  Component Value Date   LDLCALC 195 (H) 07/30/2019   Lab Results  Component Value Date   TRIG 185 (H) 07/30/2019   Lab Results  Component Value Date   CHOLHDL 6.8 (H) 07/30/2019   No results found for: HGBA1C   ASSESSMENT AND PLAN:   1) HTN: The current medical regimen is effective;  continue present plan and medications. CMET today.  2) HLD: tolerating statin. FLP and AST/ALT.  3) Health maintenance exam: Reviewed age and gender appropriate health maintenance issues (prudent diet, regular exercise, health risks of tobacco and excessive alcohol, use of seatbelts, fire alarms in home, use of sunscreen).  Also reviewed age and gender appropriate health screening as well as vaccine recommendations. Vaccines: pneumovax 23->given today.   Otherwise ALL UTD. Labs: CBC w/diff, CMET, FLP. DEXA: 04/2019 oseopenia stable. Breast ca screening:   [facility]->completed 04/2019 (normal). Colon ca screening:  Recall 2023.  An After Visit Summary was printed and given to the patient.  FOLLOW UP:  Return in about 6 months (around 03/26/2020) for routine chronic illness f/u.  Signed:  Crissie Sickles, MD           09/27/2019

## 2019-09-27 NOTE — Patient Instructions (Signed)
Health Maintenance, Female Adopting a healthy lifestyle and getting preventive care are important in promoting health and wellness. Ask your health care provider about:  The right schedule for you to have regular tests and exams.  Things you can do on your own to prevent diseases and keep yourself healthy. What should I know about diet, weight, and exercise? Eat a healthy diet   Eat a diet that includes plenty of vegetables, fruits, low-fat dairy products, and lean protein.  Do not eat a lot of foods that are high in solid fats, added sugars, or sodium. Maintain a healthy weight Body mass index (BMI) is used to identify weight problems. It estimates body fat based on height and weight. Your health care provider can help determine your BMI and help you achieve or maintain a healthy weight. Get regular exercise Get regular exercise. This is one of the most important things you can do for your health. Most adults should:  Exercise for at least 150 minutes each week. The exercise should increase your heart rate and make you sweat (moderate-intensity exercise).  Do strengthening exercises at least twice a week. This is in addition to the moderate-intensity exercise.  Spend less time sitting. Even light physical activity can be beneficial. Watch cholesterol and blood lipids Have your blood tested for lipids and cholesterol at 71 years of age, then have this test every 5 years. Have your cholesterol levels checked more often if:  Your lipid or cholesterol levels are high.  You are older than 71 years of age.  You are at high risk for heart disease. What should I know about cancer screening? Depending on your health history and family history, you may need to have cancer screening at various ages. This may include screening for:  Breast cancer.  Cervical cancer.  Colorectal cancer.  Skin cancer.  Lung cancer. What should I know about heart disease, diabetes, and high blood  pressure? Blood pressure and heart disease  High blood pressure causes heart disease and increases the risk of stroke. This is more likely to develop in people who have high blood pressure readings, are of African descent, or are overweight.  Have your blood pressure checked: ? Every 3-5 years if you are 18-39 years of age. ? Every year if you are 40 years old or older. Diabetes Have regular diabetes screenings. This checks your fasting blood sugar level. Have the screening done:  Once every three years after age 40 if you are at a normal weight and have a low risk for diabetes.  More often and at a younger age if you are overweight or have a high risk for diabetes. What should I know about preventing infection? Hepatitis B If you have a higher risk for hepatitis B, you should be screened for this virus. Talk with your health care provider to find out if you are at risk for hepatitis B infection. Hepatitis C Testing is recommended for:  Everyone born from 1945 through 1965.  Anyone with known risk factors for hepatitis C. Sexually transmitted infections (STIs)  Get screened for STIs, including gonorrhea and chlamydia, if: ? You are sexually active and are younger than 71 years of age. ? You are older than 71 years of age and your health care provider tells you that you are at risk for this type of infection. ? Your sexual activity has changed since you were last screened, and you are at increased risk for chlamydia or gonorrhea. Ask your health care provider if   you are at risk.  Ask your health care provider about whether you are at high risk for HIV. Your health care provider may recommend a prescription medicine to help prevent HIV infection. If you choose to take medicine to prevent HIV, you should first get tested for HIV. You should then be tested every 3 months for as long as you are taking the medicine. Pregnancy  If you are about to stop having your period (premenopausal) and  you may become pregnant, seek counseling before you get pregnant.  Take 400 to 800 micrograms (mcg) of folic acid every day if you become pregnant.  Ask for birth control (contraception) if you want to prevent pregnancy. Osteoporosis and menopause Osteoporosis is a disease in which the bones lose minerals and strength with aging. This can result in bone fractures. If you are 65 years old or older, or if you are at risk for osteoporosis and fractures, ask your health care provider if you should:  Be screened for bone loss.  Take a calcium or vitamin D supplement to lower your risk of fractures.  Be given hormone replacement therapy (HRT) to treat symptoms of menopause. Follow these instructions at home: Lifestyle  Do not use any products that contain nicotine or tobacco, such as cigarettes, e-cigarettes, and chewing tobacco. If you need help quitting, ask your health care provider.  Do not use street drugs.  Do not share needles.  Ask your health care provider for help if you need support or information about quitting drugs. Alcohol use  Do not drink alcohol if: ? Your health care provider tells you not to drink. ? You are pregnant, may be pregnant, or are planning to become pregnant.  If you drink alcohol: ? Limit how much you use to 0-1 drink a day. ? Limit intake if you are breastfeeding.  Be aware of how much alcohol is in your drink. In the [place] one drink equals one 12 oz bottle of beer (355 mL), one 5 oz glass of wine (148 mL), or one 1 oz glass of hard liquor (44 mL). General instructions  Schedule regular health, dental, and eye exams.  Stay current with your vaccines.  Tell your health care provider if: ? You often feel depressed. ? You have ever been abused or do not feel safe at home. Summary  Adopting a healthy lifestyle and getting preventive care are important in promoting health and wellness.  Follow your health care provider's instructions about healthy  diet, exercising, and getting tested or screened for diseases.  Follow your health care provider's instructions on monitoring your cholesterol and blood pressure. This information is not intended to replace advice given to you by your health care provider. Make sure you discuss any questions you have with your health care provider. Document Released: 05/13/2011 Document Revised: 10/21/2018 Document Reviewed: 10/21/2018 Elsevier Patient Education  2020 Elsevier Inc.  

## 2019-09-27 NOTE — Addendum Note (Signed)
Addended by: Deveron Furlong D on: 09/27/2019 08:37 AM   Modules accepted: Orders

## 2019-09-28 ENCOUNTER — Other Ambulatory Visit: Payer: Self-pay

## 2019-09-28 LAB — LIPID PANEL
Cholesterol: 202 mg/dL — ABNORMAL HIGH (ref <200)
HDL: 47 mg/dL — ABNORMAL LOW (ref >=50)
LDL Cholesterol (Calc): 125 mg/dL (calc) — ABNORMAL HIGH
Non-HDL Cholesterol (Calc): 155 mg/dL (calc) — ABNORMAL HIGH (ref <130)
Total CHOL/HDL Ratio: 4.3 (calc) (ref <5.0)
Triglycerides: 182 mg/dL — ABNORMAL HIGH (ref <150)

## 2019-09-28 LAB — COMPREHENSIVE METABOLIC PANEL
AG Ratio: 2 (calc) (ref 1.0–2.5)
ALT: 20 U/L (ref 6–29)
AST: 23 U/L (ref 10–35)
Albumin: 4.2 g/dL (ref 3.6–5.1)
Alkaline phosphatase (APISO): 77 U/L (ref 37–153)
BUN: 11 mg/dL (ref 7–25)
CO2: 26 mmol/L (ref 20–32)
Calcium: 9.6 mg/dL (ref 8.6–10.4)
Chloride: 96 mmol/L — ABNORMAL LOW (ref 98–110)
Creat: 0.86 mg/dL (ref 0.60–0.93)
Globulin: 2.1 g/dL (calc) (ref 1.9–3.7)
Glucose, Bld: 97 mg/dL (ref 65–99)
Potassium: 3.7 mmol/L (ref 3.5–5.3)
Sodium: 134 mmol/L — ABNORMAL LOW (ref 135–146)
Total Bilirubin: 0.9 mg/dL (ref 0.2–1.2)
Total Protein: 6.3 g/dL (ref 6.1–8.1)

## 2019-09-28 LAB — CBC WITH DIFFERENTIAL/PLATELET
Absolute Monocytes: 462 cells/uL (ref 200–950)
Basophils Absolute: 39 cells/uL (ref 0–200)
Basophils Relative: 0.7 %
Eosinophils Absolute: 121 cells/uL (ref 15–500)
Eosinophils Relative: 2.2 %
HCT: 44 % (ref 35.0–45.0)
Hemoglobin: 15.1 g/dL (ref 11.7–15.5)
Lymphs Abs: 1463 cells/uL (ref 850–3900)
MCH: 29.8 pg (ref 27.0–33.0)
MCHC: 34.3 g/dL (ref 32.0–36.0)
MCV: 87 fL (ref 80.0–100.0)
MPV: 9 fL (ref 7.5–12.5)
Monocytes Relative: 8.4 %
Neutro Abs: 3416 cells/uL (ref 1500–7800)
Neutrophils Relative %: 62.1 %
Platelets: 216 10*3/uL (ref 140–400)
RBC: 5.06 10*6/uL (ref 3.80–5.10)
RDW: 12.8 % (ref 11.0–15.0)
Total Lymphocyte: 26.6 %
WBC: 5.5 10*3/uL (ref 3.8–10.8)

## 2019-09-28 MED ORDER — ATORVASTATIN CALCIUM 10 MG PO TABS: 10.0000 mg | ORAL_TABLET | Freq: Every day | ORAL | 1 refills | Status: DC

## 2019-10-05 ENCOUNTER — Other Ambulatory Visit: Payer: Self-pay | Admitting: Family Medicine

## 2019-10-05 ENCOUNTER — Ambulatory Visit: Payer: Medicare HMO | Admitting: Cardiology

## 2019-10-06 ENCOUNTER — Encounter: Payer: Self-pay | Admitting: Family Medicine

## 2019-10-12 ENCOUNTER — Encounter: Payer: Medicare HMO | Admitting: Family Medicine

## 2019-10-22 DIAGNOSIS — I10 Essential (primary) hypertension: Secondary | ICD-10-CM | POA: Diagnosis not present

## 2019-10-22 DIAGNOSIS — E785 Hyperlipidemia, unspecified: Secondary | ICD-10-CM | POA: Diagnosis not present

## 2019-10-22 DIAGNOSIS — R0600 Dyspnea, unspecified: Secondary | ICD-10-CM | POA: Diagnosis not present

## 2019-10-26 ENCOUNTER — Other Ambulatory Visit: Payer: Self-pay | Admitting: Family Medicine

## 2019-11-01 DIAGNOSIS — E785 Hyperlipidemia, unspecified: Secondary | ICD-10-CM | POA: Diagnosis not present

## 2019-11-01 DIAGNOSIS — I10 Essential (primary) hypertension: Secondary | ICD-10-CM | POA: Diagnosis not present

## 2019-11-01 DIAGNOSIS — R0600 Dyspnea, unspecified: Secondary | ICD-10-CM | POA: Diagnosis not present

## 2019-11-03 ENCOUNTER — Encounter: Payer: Self-pay | Admitting: Family Medicine

## 2019-11-15 DIAGNOSIS — G5603 Carpal tunnel syndrome, bilateral upper limbs: Secondary | ICD-10-CM | POA: Insufficient documentation

## 2019-11-15 DIAGNOSIS — M47812 Spondylosis without myelopathy or radiculopathy, cervical region: Secondary | ICD-10-CM | POA: Insufficient documentation

## 2019-11-15 DIAGNOSIS — M1811 Unilateral primary osteoarthritis of first carpometacarpal joint, right hand: Secondary | ICD-10-CM | POA: Diagnosis not present

## 2019-11-15 DIAGNOSIS — M542 Cervicalgia: Secondary | ICD-10-CM | POA: Diagnosis not present

## 2019-11-17 ENCOUNTER — Other Ambulatory Visit: Payer: Self-pay

## 2019-11-17 DIAGNOSIS — M1811 Unilateral primary osteoarthritis of first carpometacarpal joint, right hand: Secondary | ICD-10-CM | POA: Diagnosis not present

## 2019-11-17 DIAGNOSIS — G5603 Carpal tunnel syndrome, bilateral upper limbs: Secondary | ICD-10-CM

## 2019-11-17 DIAGNOSIS — M79644 Pain in right finger(s): Secondary | ICD-10-CM | POA: Diagnosis not present

## 2019-11-29 ENCOUNTER — Other Ambulatory Visit: Payer: Self-pay

## 2019-11-30 ENCOUNTER — Ambulatory Visit (INDEPENDENT_AMBULATORY_CARE_PROVIDER_SITE_OTHER): Payer: Medicare HMO | Admitting: Neurology

## 2019-11-30 ENCOUNTER — Other Ambulatory Visit: Payer: Self-pay

## 2019-11-30 DIAGNOSIS — G5603 Carpal tunnel syndrome, bilateral upper limbs: Secondary | ICD-10-CM

## 2019-11-30 NOTE — Procedures (Signed)
[facility]  [address], [address]  [place], [place] [address] Tel: [phone] Fax:  [phone] Test Date:  11/30/2019  Patient: Kathy Richardson DOB: 30-Sep-1946 Physician: Narda Amber, DO  Sex: Female Height: 5\' 2"  Ref Phys: Leanora Cover, MD  ID#: [id number] Temp: 34.0C Technician:    Patient Complaints: This is a 74 year old female referred for evaluation of bilateral hand numbness and tingling.  NCV & EMG Findings: Extensive electrodiagnostic testing of the right upper extremity and additional studies of the left shows:  1. Bilateral mixed palmar sensory responses show prolonged latency (Median Palm, R2.3, L2.3 ms) and abnormal peak latency difference (Median Palm-Ulnar Palm, R0.5, L0.4 ms).  Bilateral median and ulnar sensory responses are within normal limits.   2. Bilateral median and ulnar motor responses are within normal limits.   3. There is no evidence of active or chronic motor axonal loss changes affecting any of the tested muscles.  Motor unit configuration and recruitment pattern is within normal limits.    Impression: Bilateral median neuropathy at or distal to the wrist (very mild), consistent with a clinical diagnosis of carpal tunnel syndrome.     ___________________________ Narda Amber, DO    Nerve Conduction Studies Anti Sensory Summary Table   Site NR Peak (ms) Norm Peak (ms) P-T Amp (V) Norm P-T Amp  Left Median Anti Sensory (2nd Digit)  34C  Wrist    3.3 <3.8 26.5 >10  Right Median Anti Sensory (2nd Digit)  34C  Wrist    3.1 <3.8 22.0 >10  Left Ulnar Anti Sensory (5th Digit)  34C  Wrist    2.9 <3.2 19.0 >5  Right Ulnar Anti Sensory (5th Digit)  34C  Wrist    2.5 <3.2 20.2 >5   Motor Summary Table   Site NR Onset (ms) Norm Onset (ms) O-P Amp (mV) Norm O-P Amp Site1 Site2 Delta-0 (ms) Dist (cm) Vel (m/s) Norm Vel (m/s)  Left Median Motor (Abd Poll Brev)  34C  Wrist    3.4 <4.0 6.2 >5 Elbow Wrist 5.1 26.0 51 >50  Elbow    8.5   5.6         Right Median Motor (Abd Poll Brev)  34C  Wrist    3.4 <4.0 7.6 >5 Elbow Wrist 4.9 26.0 53 >50  Elbow    8.3  7.2         Left Ulnar Motor (Abd Dig Minimi)  34C  Wrist    2.6 <3.1 10.6 >7 B Elbow Wrist 3.4 22.0 65 >50  B Elbow    6.0  9.0  A Elbow B Elbow 1.6 10.0 63 >50  A Elbow    7.6  8.8         Right Ulnar Motor (Abd Dig Minimi)  34C  Wrist    2.7 <3.1 9.7 >7 B Elbow Wrist 3.0 20.0 67 >50  B Elbow    5.7  9.0  A Elbow B Elbow 1.6 10.0 63 >50  A Elbow    7.3  8.7          Comparison Summary Table   Site NR Peak (ms) Norm Peak (ms) P-T Amp (V) Site1 Site2 Delta-P (ms) Norm Delta (ms)  Left Median/Ulnar Palm Comparison (Wrist - 8cm)  34C  Median Palm    2.3 <2.2 33.7 Median Palm Ulnar Palm 0.4   Ulnar Palm    1.9 <2.2 13.3      Right Median/Ulnar Palm Comparison (Wrist - 8cm)  34C  Median Palm    2.3 <2.2 36.3 Median Palm Ulnar Palm 0.5   Ulnar Palm    1.8 <2.2 12.3       EMG   Side Muscle Ins Act Fibs Psw Fasc Number Recrt Dur Dur. Amp Amp. Poly Poly. Comment  Right 1stDorInt Nml Nml Nml Nml Nml Nml Nml Nml Nml Nml Nml Nml N/A  Right Abd Poll Brev Nml Nml Nml Nml Nml Nml Nml Nml Nml Nml Nml Nml N/A  Right PronatorTeres Nml Nml Nml Nml Nml Nml Nml Nml Nml Nml Nml Nml N/A  Right Biceps Nml Nml Nml Nml Nml Nml Nml Nml Nml Nml Nml Nml N/A  Right Triceps Nml Nml Nml Nml Nml Nml Nml Nml Nml Nml Nml Nml N/A  Right Deltoid Nml Nml Nml Nml Nml Nml Nml Nml Nml Nml Nml Nml N/A  Left 1stDorInt Nml Nml Nml Nml Nml Nml Nml Nml Nml Nml Nml Nml N/A  Left Abd Poll Brev Nml Nml Nml Nml Nml Nml Nml Nml Nml Nml Nml Nml N/A  Left PronatorTeres Nml Nml Nml Nml Nml Nml Nml Nml Nml Nml Nml Nml N/A  Left Biceps Nml Nml Nml Nml Nml Nml Nml Nml Nml Nml Nml Nml N/A  Left Triceps Nml Nml Nml Nml Nml Nml Nml Nml Nml Nml Nml Nml N/A  Left Deltoid Nml Nml Nml Nml Nml Nml Nml Nml Nml Nml Nml Nml N/A      Waveforms:

## 2019-12-06 DIAGNOSIS — G5603 Carpal tunnel syndrome, bilateral upper limbs: Secondary | ICD-10-CM | POA: Diagnosis not present

## 2019-12-06 DIAGNOSIS — M1811 Unilateral primary osteoarthritis of first carpometacarpal joint, right hand: Secondary | ICD-10-CM | POA: Diagnosis not present

## 2019-12-07 ENCOUNTER — Other Ambulatory Visit: Payer: Self-pay | Admitting: Family Medicine

## 2019-12-07 DIAGNOSIS — R69 Illness, unspecified: Secondary | ICD-10-CM | POA: Diagnosis not present

## 2019-12-10 ENCOUNTER — Encounter: Payer: Self-pay | Admitting: Family Medicine

## 2019-12-19 ENCOUNTER — Ambulatory Visit: Payer: Medicare HMO

## 2019-12-23 ENCOUNTER — Other Ambulatory Visit: Payer: Self-pay | Admitting: Family Medicine

## 2020-01-06 ENCOUNTER — Ambulatory Visit: Payer: Medicare HMO

## 2020-01-14 DIAGNOSIS — I10 Essential (primary) hypertension: Secondary | ICD-10-CM | POA: Diagnosis not present

## 2020-01-14 DIAGNOSIS — E785 Hyperlipidemia, unspecified: Secondary | ICD-10-CM | POA: Diagnosis not present

## 2020-01-14 DIAGNOSIS — I493 Ventricular premature depolarization: Secondary | ICD-10-CM | POA: Diagnosis not present

## 2020-01-14 DIAGNOSIS — R0600 Dyspnea, unspecified: Secondary | ICD-10-CM | POA: Diagnosis not present

## 2020-01-31 DIAGNOSIS — H52223 Regular astigmatism, bilateral: Secondary | ICD-10-CM | POA: Diagnosis not present

## 2020-01-31 DIAGNOSIS — H524 Presbyopia: Secondary | ICD-10-CM | POA: Diagnosis not present

## 2020-02-06 ENCOUNTER — Other Ambulatory Visit: Payer: Self-pay | Admitting: Family Medicine

## 2020-03-02 ENCOUNTER — Other Ambulatory Visit: Payer: Self-pay | Admitting: Family Medicine

## 2020-03-09 ENCOUNTER — Other Ambulatory Visit: Payer: Self-pay | Admitting: Family Medicine

## 2020-03-09 NOTE — Telephone Encounter (Signed)
Requesting: Lorazepam Contract:12/01/18 UDS:n/a Last Visit:09/27/19 Next Visit:03/27/20 Last Refill:08/30/19(60,1)  Please Advise. Medication pending

## 2020-03-10 NOTE — Telephone Encounter (Signed)
Refill sent.

## 2020-03-21 ENCOUNTER — Other Ambulatory Visit: Payer: Self-pay | Admitting: Family Medicine

## 2020-03-24 ENCOUNTER — Other Ambulatory Visit: Payer: Self-pay

## 2020-03-27 ENCOUNTER — Encounter: Payer: Self-pay | Admitting: Family Medicine

## 2020-03-27 ENCOUNTER — Other Ambulatory Visit: Payer: Self-pay

## 2020-03-27 ENCOUNTER — Ambulatory Visit (INDEPENDENT_AMBULATORY_CARE_PROVIDER_SITE_OTHER): Payer: Medicare HMO | Admitting: Family Medicine

## 2020-03-27 VITALS — BP 114/70 | HR 55 | Temp 98.0°F | Resp 16 | Ht 62.0 in | Wt 144.4 lb

## 2020-03-27 DIAGNOSIS — R69 Illness, unspecified: Secondary | ICD-10-CM | POA: Diagnosis not present

## 2020-03-27 DIAGNOSIS — E7800 Pure hypercholesterolemia, unspecified: Secondary | ICD-10-CM

## 2020-03-27 DIAGNOSIS — N182 Chronic kidney disease, stage 2 (mild): Secondary | ICD-10-CM | POA: Diagnosis not present

## 2020-03-27 DIAGNOSIS — F411 Generalized anxiety disorder: Secondary | ICD-10-CM

## 2020-03-27 DIAGNOSIS — R001 Bradycardia, unspecified: Secondary | ICD-10-CM

## 2020-03-27 DIAGNOSIS — I10 Essential (primary) hypertension: Secondary | ICD-10-CM | POA: Diagnosis not present

## 2020-03-27 DIAGNOSIS — T887XXA Unspecified adverse effect of drug or medicament, initial encounter: Secondary | ICD-10-CM

## 2020-03-27 DIAGNOSIS — R002 Palpitations: Secondary | ICD-10-CM

## 2020-03-27 MED ORDER — IRBESARTAN 75 MG PO TABS: ORAL_TABLET | ORAL | 1 refills | Status: DC

## 2020-03-27 NOTE — Progress Notes (Signed)
CC:  Ms. Kathy Richardson is a 74 y.o. Caucasian female with a PMH of HTN, HLD, CRI with GFR approx 60 ml/min, and GAD who presents for 6 mo f/u.   HPI:   palpitations and BP issues -> cardiologist Dr. Loni Beckwith -> did echo and stress -> bigemini heart beat, PACs/PVCs, and bradycardia (took off metoprolol) so now just on irbesartan.  Also not taking vicodin any more   HTN: due to systolics in Q000111Q for a while after getting off her toprol, she increased her irbesartan to 1 tab qAM and 1/2 qPM and bp's have been normal now. BP at office: 114/70 BP at home: 120s/70s Tolerating meds? Yes Leg swelling? Urinating a lot? No Pre/syncope? A little bit if leaning over and standing up too quickly but this feeling passes   HLD: Tolerating meds? yes   CRI: avoids NSAIDs, hydrates well.  GAD:  Mood ok? Anxiety controlled? Yes  Tolerating meds? Yes.  Only uses lorazepam about once q 2 wks avg.  Diet: "has been better" ; eating lots of snacks like nuts and M&Ms and chips ; fried and fatty foods avoiding  Exercise: had not been walking bc heart issues ; now walking 3 miles 4-5x/wk  Life stressors or changes: no  Sleep: 7-8hrs / night   ROS: no fevers, no CP, no SOB, no wheezing, no cough, no dizziness, no HAs, no rashes, no melena/hematochezia.  No polyuria or polydipsia.  No myalgias or arthralgias.  No focal weakness, paresthesias, or tremors.  No acute vision or hearing abnormalities. No n/v/d or abd pain.     PMH: Past Medical History:  Diagnosis Date  . Allergic rhinitis   . Basal cell carcinoma    X 3: one on each leg and one on right shoulder  . Carpal tunnel syndrome on both sides    11/17/19 NCS/EMG; mild.  Dr. Fredna Dow  . Chronic renal insufficiency, stage II (mild)    GFR 60s  . Family history of colon cancer in father    Age 65.  Next colonoscopy due 04/2022.  Marland Kitchen Generalized anxiety disorder   . History of adenomatous polyp of colon 2008; 2013; 04/2017   2018--Recall 5 yrs  (Dr. Megan Salon w/[facility])  . Hyperlipidemia, mixed    simva 2016; atorva 05/2019  . Hypertension   . Mitral valve prolapse   . Osteoarthritis    Fingers and hips.  . Osteopenia    DEXA 03/2019. T-score -2.0-->rpt 2 yrs (Dr. Julien Girt)  . Palpitations    3d event monitor normal 08/26/19.  +Bradycardia and PVCs, followed by cardiology (BB d/c'd 01/2020).    M/A: Current Outpatient Medications on File Prior to Visit  Medication Sig Dispense Refill  . atorvastatin (LIPITOR) 10 MG tablet Take 1 tablet (10 mg total) by mouth daily. 90 tablet 1  . HYDROcodone-acetaminophen (NORCO/VICODIN) 5-325 MG tablet 1-2 tabs po bid prn jaw pain 60 tablet 0  . irbesartan (AVAPRO) 75 MG tablet TAKE 1/2 TABLET BY MOUTH DAILY 45 tablet 1  . LORazepam (ATIVAN) 0.5 MG tablet TAKE 1 TO 2 TABLETS BY MOUTH TWICE A DAY AS NEEDED FOR ANXIETY 60 tablet 1  . meclizine (ANTIVERT) 25 MG tablet Take 1 tablet (25 mg total) by mouth 3 (three) times daily as needed for dizziness. 30 tablet 0  . metoprolol succinate (TOPROL-XL) 25 MG 24 hr tablet Take 25 mg by mouth daily.    Marland Kitchen PARoxetine (PAXIL) 20 MG tablet TAKE 1 TABLET BY MOUTH EVERY DAY 90 tablet  0   No current facility-administered medications on file prior to visit.   Allergies  Allergen Reactions  . Citalopram Other (See Comments)    Heatwaves/hot flashes  . Lisinopril Other (See Comments)    Jaw pain  . Simvastatin Other (See Comments)    Orthostatic dizziness    FH: Family History  Problem Relation Age of Onset  . Breast cancer Mother        dx'd age 46  . Heart attack Mother   . Heart disease Mother   . Stroke Father   . Hypertension Father   . Cancer Father   . Hypertension Sister   . Cancer Brother   . Hypertension Brother   . Heart attack Brother   . Pancreatitis Brother   . Hypertension Sister   . Myasthenia gravis Sister     SH: Social History   Socioeconomic History  . Marital status: Married    Spouse name: Not on file  . Number of  children: Not on file  . Years of education: Not on file  . Highest education level: Not on file  Occupational History  . Not on file  Tobacco Use  . Smoking status: Never Smoker  . Smokeless tobacco: Never Used  Substance and Sexual Activity  . Alcohol use: Yes    Alcohol/week: 1.0 standard drinks    Types: 1 Glasses of wine per week    Comment: 4-5 times weekly  . Drug use: No  . Sexual activity: Not on file  Other Topics Concern  . Not on file  Social History Narrative   Married, 1 son, 1 grand-daughter.   Orig from [place], then [place] for 30 yrs, relocated back to [place].   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   NO tob.  Alc: 1 glass wine 4-5 times per week.   Social Determinants of Health   Financial Resource Strain:   . Difficulty of Paying Living Expenses:   Food Insecurity:   . Worried About Charity fundraiser in the Last Year:   . Arboriculturist in the Last Year:   Transportation Needs:   . Film/video editor (Medical):   Marland Kitchen Lack of Transportation (Non-Medical):   Physical Activity:   . Days of Exercise per Week:   . Minutes of Exercise per Session:   Stress:   . Feeling of Stress :   Social Connections:   . Frequency of Communication with Friends and Family:   . Frequency of Social Gatherings with Friends and Family:   . Attends Religious Services:   . Active Member of Clubs or Organizations:   . Attends Archivist Meetings:   Marland Kitchen Marital Status:     ROS: See HPI  PE: Vitals with BMI 09/27/2019 07/30/2019 06/23/2019  Height 5\' 2"  5\' 2"  5\' 2"   Weight 140 lbs 13 oz 139 lbs 2 oz 140 lbs  BMI 25.75 [id number]  Systolic Q000111Q [id number]  Diastolic 83 74 82  Pulse 67 63 57    Physical Exam  Constitutional: She is oriented to person, place, and time. No distress.  HENT:  Head: Normocephalic and atraumatic.  Eyes: Pupils are equal, round, and reactive to light. Conjunctivae and EOM are normal. Right  eye exhibits no discharge. Left eye exhibits no discharge. No scleral icterus.  Neck: No JVD present. No tracheal deviation present. No thyromegaly present.  Cardiovascular: Normal rate, regular rhythm, normal heart sounds  and intact distal pulses. Exam reveals no gallop and no friction rub.  No murmur heard. Pulmonary/Chest: Effort normal and breath sounds normal. No stridor. No respiratory distress. She has no wheezes. She has no rales. She exhibits no tenderness.  Musculoskeletal:        General: No edema.     Cervical back: Normal range of motion and neck supple.  Lymphadenopathy:    She has no cervical adenopathy.  Neurological: She is alert and oriented to person, place, and time.  Skin: Skin is warm and dry. No rash noted. She is not diaphoretic. No erythema. No pallor.  Psychiatric: Mood, memory, affect and judgment normal.    Labs: Lab Results  Component Value Date   TSH 1.84 07/30/2019   Lab Results  Component Value Date   WBC 5.5 09/27/2019   HGB 15.1 09/27/2019   HCT 44.0 09/27/2019   MCV 87.0 09/27/2019   PLT 216 09/27/2019   Lab Results  Component Value Date   CREATININE 0.86 09/27/2019   BUN 11 09/27/2019   NA 134 (L) 09/27/2019   K 3.7 09/27/2019   CL 96 (L) 09/27/2019   CO2 26 09/27/2019   Lab Results  Component Value Date   ALT 20 09/27/2019   AST 23 09/27/2019   ALKPHOS 77 02/03/2017   BILITOT 0.9 09/27/2019   Lab Results  Component Value Date   CHOL 202 (H) 09/27/2019   Lab Results  Component Value Date   HDL 47 (L) 09/27/2019   Lab Results  Component Value Date   LDLCALC 125 (H) 09/27/2019   Lab Results  Component Value Date   TRIG 182 (H) 09/27/2019   Lab Results  Component Value Date   CHOLHDL 4.3 09/27/2019    A/P: In summary, Ms. Kathy Richardson is a 74 year old woman with a past medical history of HTN, HLD, CRI GFR around 60 mL/min, and GAD who presents for 6 month follow up. On physical exam, she is unremarkable. We reviewed  age and gender appropriate health maintenance issues (prudent diet, regular exercise, health risks of tobacco and excessive alcohol, use of seatbelts, fire alarms in home, use of sunscreen). Also reviewed age and gender appropriate health screening as well as vaccine recommendations. My plan is  HTN; stable. continue meds as indicated HLD: stable.  continue meds as indicated and pursue active lifestyle changes  CRI: continue hydrating well and avoiding frequent use of NSAIDs. GAD: continue meds as indicated, renewed CSC today for her occ use of lorazepam.  No new rx for this med was needed today. Labs: check a CMP and FLP. Vaccines: Tdap and COVID UTD. Colon ca screening:  will address at next 6 mo follow up Breast cancer screening: follow up w obgyn Cervical cancer screening: follow up w obgyn    Follow Up:  6 months   Signed: Bennie Dallas, MS3 27 Mar 2020   I personally was present during the history, physical exam, and medical decision-making activities of this service and have verified that the service and findings are accurately documented in the student's note. Signed:  Crissie Sickles, MD           03/27/2020

## 2020-03-27 NOTE — Progress Notes (Signed)
See med student note from this date. Signed:  Crissie Sickles, MD           03/27/2020

## 2020-03-28 ENCOUNTER — Other Ambulatory Visit: Payer: Self-pay | Admitting: Family Medicine

## 2020-03-28 LAB — COMPREHENSIVE METABOLIC PANEL
AG Ratio: 1.7 (calc) (ref 1.0–2.5)
ALT: 21 U/L (ref 6–29)
AST: 20 U/L (ref 10–35)
Albumin: 4.2 g/dL (ref 3.6–5.1)
Alkaline phosphatase (APISO): 89 U/L (ref 37–153)
BUN: 14 mg/dL (ref 7–25)
CO2: 25 mmol/L (ref 20–32)
Calcium: 9.9 mg/dL (ref 8.6–10.4)
Chloride: 103 mmol/L (ref 98–110)
Creat: 0.89 mg/dL (ref 0.60–0.93)
Globulin: 2.5 g/dL (calc) (ref 1.9–3.7)
Glucose, Bld: 98 mg/dL (ref 65–99)
Potassium: 4.6 mmol/L (ref 3.5–5.3)
Sodium: 139 mmol/L (ref 135–146)
Total Bilirubin: 1.2 mg/dL (ref 0.2–1.2)
Total Protein: 6.7 g/dL (ref 6.1–8.1)

## 2020-03-28 LAB — LIPID PANEL
Cholesterol: 203 mg/dL — ABNORMAL HIGH (ref <200)
HDL: 55 mg/dL (ref >=50)
LDL Cholesterol (Calc): 119 mg/dL (calc) — ABNORMAL HIGH
Non-HDL Cholesterol (Calc): 148 mg/dL (calc) — ABNORMAL HIGH (ref <130)
Total CHOL/HDL Ratio: 3.7 (calc) (ref <5.0)
Triglycerides: 173 mg/dL — ABNORMAL HIGH (ref <150)

## 2020-03-28 LAB — EXTRA LAV TOP TUBE

## 2020-03-28 MED ORDER — ATORVASTATIN CALCIUM 20 MG PO TABS: 20.0000 mg | ORAL_TABLET | Freq: Every day | ORAL | 2 refills | Status: DC

## 2020-06-01 ENCOUNTER — Other Ambulatory Visit: Payer: Self-pay | Admitting: Family Medicine

## 2020-06-08 DIAGNOSIS — R69 Illness, unspecified: Secondary | ICD-10-CM | POA: Diagnosis not present

## 2020-06-12 ENCOUNTER — Ambulatory Visit (INDEPENDENT_AMBULATORY_CARE_PROVIDER_SITE_OTHER): Payer: Medicare HMO | Admitting: Family Medicine

## 2020-06-12 ENCOUNTER — Other Ambulatory Visit: Payer: Self-pay

## 2020-06-12 DIAGNOSIS — E782 Mixed hyperlipidemia: Secondary | ICD-10-CM | POA: Diagnosis not present

## 2020-06-12 LAB — HEPATIC FUNCTION PANEL
ALT: 21 U/L (ref 0–35)
AST: 20 U/L (ref 0–37)
Albumin: 4.1 g/dL (ref 3.5–5.2)
Alkaline Phosphatase: 82 U/L (ref 39–117)
Bilirubin, Direct: 0.2 mg/dL (ref 0.0–0.3)
Total Bilirubin: 1.2 mg/dL (ref 0.2–1.2)
Total Protein: 6.1 g/dL (ref 6.0–8.3)

## 2020-06-12 LAB — LIPID PANEL
Cholesterol: 158 mg/dL (ref 0–200)
HDL: 40.9 mg/dL (ref >39.00)
LDL Cholesterol: 78 mg/dL (ref 0–99)
NonHDL: 117.59
Total CHOL/HDL Ratio: 4
Triglycerides: 199 mg/dL — ABNORMAL HIGH (ref 0.0–149.0)
VLDL: 39.8 mg/dL (ref 0.0–40.0)

## 2020-06-13 ENCOUNTER — Other Ambulatory Visit: Payer: Self-pay

## 2020-06-13 MED ORDER — ATORVASTATIN CALCIUM 20 MG PO TABS: 20.0000 mg | ORAL_TABLET | Freq: Every day | ORAL | 3 refills | Status: DC

## 2020-06-16 DIAGNOSIS — Z124 Encounter for screening for malignant neoplasm of cervix: Secondary | ICD-10-CM | POA: Diagnosis not present

## 2020-06-16 DIAGNOSIS — Z6826 Body mass index (BMI) 26.0-26.9, adult: Secondary | ICD-10-CM | POA: Diagnosis not present

## 2020-06-16 DIAGNOSIS — Z1231 Encounter for screening mammogram for malignant neoplasm of breast: Secondary | ICD-10-CM | POA: Diagnosis not present

## 2020-06-16 DIAGNOSIS — Z01419 Encounter for gynecological examination (general) (routine) without abnormal findings: Secondary | ICD-10-CM | POA: Diagnosis not present

## 2020-06-16 LAB — RESULTS CONSOLE HPV: CHL HPV: NEGATIVE

## 2020-06-16 LAB — HM PAP SMEAR: HM Pap smear: NORMAL

## 2020-06-27 ENCOUNTER — Encounter: Payer: Self-pay | Admitting: Family Medicine

## 2020-06-27 NOTE — Telephone Encounter (Signed)
Patient is requesting RF for Irbesartan and new Rx for Paroxetine 10mg  since she only takes 1/2 tab qd.  RF request for Irbesartan 75mg  LOV:03/27/20 Next ov: 09/29/20 Last written:03/27/20(45,1)  RF request for Paroxetine 10mg  LOV:03/27/20 Next ov: 09/29/20 Last written:03/21/20 (90,0)  Medications pending. Please advise, thanks.

## 2020-06-28 MED ORDER — IRBESARTAN 75 MG PO TABS: ORAL_TABLET | ORAL | 3 refills | Status: DC

## 2020-06-28 MED ORDER — PAROXETINE HCL 10 MG PO TABS: 10.0000 mg | ORAL_TABLET | Freq: Every day | ORAL | 3 refills | Status: DC

## 2020-07-07 DIAGNOSIS — C44729 Squamous cell carcinoma of skin of left lower limb, including hip: Secondary | ICD-10-CM | POA: Diagnosis not present

## 2020-07-07 DIAGNOSIS — C44519 Basal cell carcinoma of skin of other part of trunk: Secondary | ICD-10-CM | POA: Diagnosis not present

## 2020-07-07 DIAGNOSIS — L814 Other melanin hyperpigmentation: Secondary | ICD-10-CM | POA: Diagnosis not present

## 2020-07-07 DIAGNOSIS — L821 Other seborrheic keratosis: Secondary | ICD-10-CM | POA: Diagnosis not present

## 2020-07-07 DIAGNOSIS — C44722 Squamous cell carcinoma of skin of right lower limb, including hip: Secondary | ICD-10-CM | POA: Diagnosis not present

## 2020-07-07 DIAGNOSIS — L570 Actinic keratosis: Secondary | ICD-10-CM | POA: Diagnosis not present

## 2020-07-07 DIAGNOSIS — D692 Other nonthrombocytopenic purpura: Secondary | ICD-10-CM | POA: Diagnosis not present

## 2020-07-07 DIAGNOSIS — Z85828 Personal history of other malignant neoplasm of skin: Secondary | ICD-10-CM | POA: Diagnosis not present

## 2020-07-07 DIAGNOSIS — D485 Neoplasm of uncertain behavior of skin: Secondary | ICD-10-CM | POA: Diagnosis not present

## 2020-07-21 DIAGNOSIS — R0600 Dyspnea, unspecified: Secondary | ICD-10-CM | POA: Diagnosis not present

## 2020-07-21 DIAGNOSIS — E785 Hyperlipidemia, unspecified: Secondary | ICD-10-CM | POA: Diagnosis not present

## 2020-07-21 DIAGNOSIS — I10 Essential (primary) hypertension: Secondary | ICD-10-CM | POA: Diagnosis not present

## 2020-07-21 DIAGNOSIS — I493 Ventricular premature depolarization: Secondary | ICD-10-CM | POA: Diagnosis not present

## 2020-07-31 DIAGNOSIS — C44519 Basal cell carcinoma of skin of other part of trunk: Secondary | ICD-10-CM | POA: Diagnosis not present

## 2020-07-31 DIAGNOSIS — C44722 Squamous cell carcinoma of skin of right lower limb, including hip: Secondary | ICD-10-CM | POA: Diagnosis not present

## 2020-07-31 DIAGNOSIS — Z85828 Personal history of other malignant neoplasm of skin: Secondary | ICD-10-CM | POA: Diagnosis not present

## 2020-07-31 DIAGNOSIS — L988 Other specified disorders of the skin and subcutaneous tissue: Secondary | ICD-10-CM | POA: Diagnosis not present

## 2020-08-11 DIAGNOSIS — R0600 Dyspnea, unspecified: Secondary | ICD-10-CM | POA: Diagnosis not present

## 2020-08-11 DIAGNOSIS — E785 Hyperlipidemia, unspecified: Secondary | ICD-10-CM | POA: Diagnosis not present

## 2020-08-11 DIAGNOSIS — I10 Essential (primary) hypertension: Secondary | ICD-10-CM | POA: Diagnosis not present

## 2020-08-11 DIAGNOSIS — I493 Ventricular premature depolarization: Secondary | ICD-10-CM | POA: Diagnosis not present

## 2020-08-11 HISTORY — PX: OTHER SURGICAL HISTORY: SHX169

## 2020-08-16 ENCOUNTER — Other Ambulatory Visit: Payer: Self-pay | Admitting: Family Medicine

## 2020-09-13 ENCOUNTER — Ambulatory Visit (INDEPENDENT_AMBULATORY_CARE_PROVIDER_SITE_OTHER): Payer: Medicare HMO

## 2020-09-13 VITALS — Ht 62.0 in | Wt 144.0 lb

## 2020-09-13 DIAGNOSIS — Z0000 Encounter for general adult medical examination without abnormal findings: Secondary | ICD-10-CM

## 2020-09-13 DIAGNOSIS — R69 Illness, unspecified: Secondary | ICD-10-CM | POA: Diagnosis not present

## 2020-09-13 NOTE — Patient Instructions (Signed)
Kathy Richardson , Thank you for taking time to complete your Medicare Wellness Visit. I appreciate your ongoing commitment to your health goals. Please review the following plan we discussed and let me know if I can assist you in the future.   Screening recommendations/referrals: Colonoscopy: Completed 04/21/2017- Due 04/21/2022 Mammogram: Completed 04/2020- at GYN office. Please have a copy of results sent to Dr. Anitra Lauth. Bone Density: Completed 04/26/2019- Due- 04/25/2021 Recommended yearly ophthalmology/optometry visit for glaucoma screening and checkup Recommended yearly dental visit for hygiene and checkup  Vaccinations: Influenza vaccine: Scheduled for today. Pneumococcal vaccine: Completed vaccines Tdap vaccine: Up to Date- Due-11/30/2024 Shingles vaccine: Completed vaccines Covid-19:Completed vaccines  Advanced directives: Please bring a copy for your chart  Conditions/risks identified: See problem list  Next appointment: Follow up in one year for your annual wellness visit 09/19/2021 @ 9:00am   Preventive Care 65 Years and Older, Female Preventive care refers to lifestyle choices and visits with your health care provider that can promote health and wellness. What does preventive care include?  A yearly physical exam. This is also called an annual well check.  Dental exams once or twice a year.  Routine eye exams. Ask your health care provider how often you should have your eyes checked.  Personal lifestyle choices, including:  Daily care of your teeth and gums.  Regular physical activity.  Eating a healthy diet.  Avoiding tobacco and drug use.  Limiting alcohol use.  Practicing safe sex.  Taking low-dose aspirin every day.  Taking vitamin and mineral supplements as recommended by your health care provider. What happens during an annual well check? The services and screenings done by your health care provider during your annual well check will depend on your age,  overall health, lifestyle risk factors, and family history of disease. Counseling  Your health care provider may ask you questions about your:  Alcohol use.  Tobacco use.  Drug use.  Emotional well-being.  Home and relationship well-being.  Sexual activity.  Eating habits.  History of falls.  Memory and ability to understand (cognition).  Work and work Statistician.  Reproductive health. Screening  You may have the following tests or measurements:  Height, weight, and BMI.  Blood pressure.  Lipid and cholesterol levels. These may be checked every 5 years, or more frequently if you are over 19 years old.  Skin check.  Lung cancer screening. You may have this screening every year starting at age 105 if you have a 30-pack-year history of smoking and currently smoke or have quit within the past 15 years.  Fecal occult blood test (FOBT) of the stool. You may have this test every year starting at age 51.  Flexible sigmoidoscopy or colonoscopy. You may have a sigmoidoscopy every 5 years or a colonoscopy every 10 years starting at age 77.  Hepatitis C blood test.  Hepatitis B blood test.  Sexually transmitted disease (STD) testing.  Diabetes screening. This is done by checking your blood sugar (glucose) after you have not eaten for a while (fasting). You may have this done every 1-3 years.  Bone density scan. This is done to screen for osteoporosis. You may have this done starting at age 28.  Mammogram. This may be done every 1-2 years. Talk to your health care provider about how often you should have regular mammograms. Talk with your health care provider about your test results, treatment options, and if necessary, the need for more tests. Vaccines  Your health care provider may recommend certain  vaccines, such as:  Influenza vaccine. This is recommended every year.  Tetanus, diphtheria, and acellular pertussis (Tdap, Td) vaccine. You may need a Td booster every 10  years.  Zoster vaccine. You may need this after age 5.  Pneumococcal 13-valent conjugate (PCV13) vaccine. One dose is recommended after age 76.  Pneumococcal polysaccharide (PPSV23) vaccine. One dose is recommended after age 75. Talk to your health care provider about which screenings and vaccines you need and how often you need them. This information is not intended to replace advice given to you by your health care provider. Make sure you discuss any questions you have with your health care provider. Document Released: 11/24/2015 Document Revised: 07/17/2016 Document Reviewed: 08/29/2015 Elsevier Interactive Patient Education  2017 [place] Prevention in the Home Falls can cause injuries. They can happen to people of all ages. There are many things you can do to make your home safe and to help prevent falls. What can I do on the outside of my home?  Regularly fix the edges of walkways and driveways and fix any cracks.  Remove anything that might make you trip as you walk through a door, such as a raised step or threshold.  Trim any bushes or trees on the path to your home.  Use bright outdoor lighting.  Clear any walking paths of anything that might make someone trip, such as rocks or tools.  Regularly check to see if handrails are loose or broken. Make sure that both sides of any steps have handrails.  Any raised decks and porches should have guardrails on the edges.  Have any leaves, snow, or ice cleared regularly.  Use sand or salt on walking paths during winter.  Clean up any spills in your garage right away. This includes oil or grease spills. What can I do in the bathroom?  Use night lights.  Install grab bars by the toilet and in the tub and shower. Do not use towel bars as grab bars.  Use non-skid mats or decals in the tub or shower.  If you need to sit down in the shower, use a plastic, non-slip stool.  Keep the floor dry. Clean up any water that  spills on the floor as soon as it happens.  Remove soap buildup in the tub or shower regularly.  Attach bath mats securely with double-sided non-slip rug tape.  Do not have throw rugs and other things on the floor that can make you trip. What can I do in the bedroom?  Use night lights.  Make sure that you have a light by your bed that is easy to reach.  Do not use any sheets or blankets that are too big for your bed. They should not hang down onto the floor.  Have a firm chair that has side arms. You can use this for support while you get dressed.  Do not have throw rugs and other things on the floor that can make you trip. What can I do in the kitchen?  Clean up any spills right away.  Avoid walking on wet floors.  Keep items that you use a lot in easy-to-reach places.  If you need to reach something above you, use a strong step stool that has a grab bar.  Keep electrical cords out of the way.  Do not use floor polish or wax that makes floors slippery. If you must use wax, use non-skid floor wax.  Do not have throw rugs and other things  on the floor that can make you trip. What can I do with my stairs?  Do not leave any items on the stairs.  Make sure that there are handrails on both sides of the stairs and use them. Fix handrails that are broken or loose. Make sure that handrails are as long as the stairways.  Check any carpeting to make sure that it is firmly attached to the stairs. Fix any carpet that is loose or worn.  Avoid having throw rugs at the top or bottom of the stairs. If you do have throw rugs, attach them to the floor with carpet tape.  Make sure that you have a light switch at the top of the stairs and the bottom of the stairs. If you do not have them, ask someone to add them for you. What else can I do to help prevent falls?  Wear shoes that:  Do not have high heels.  Have rubber bottoms.  Are comfortable and fit you well.  Are closed at the  toe. Do not wear sandals.  If you use a stepladder:  Make sure that it is fully opened. Do not climb a closed stepladder.  Make sure that both sides of the stepladder are locked into place.  Ask someone to hold it for you, if possible.  Clearly mark and make sure that you can see:  Any grab bars or handrails.  First and last steps.  Where the edge of each step is.  Use tools that help you move around (mobility aids) if they are needed. These include:  Canes.  Walkers.  Scooters.  Crutches.  Turn on the lights when you go into a dark area. Replace any light bulbs as soon as they burn out.  Set up your furniture so you have a clear path. Avoid moving your furniture around.  If any of your floors are uneven, fix them.  If there are any pets around you, be aware of where they are.  Review your medicines with your doctor. Some medicines can make you feel dizzy. This can increase your chance of falling. Ask your doctor what other things that you can do to help prevent falls. This information is not intended to replace advice given to you by your health care provider. Make sure you discuss any questions you have with your health care provider. Document Released: 08/24/2009 Document Revised: 04/04/2016 Document Reviewed: 12/02/2014 Elsevier Interactive Patient Education  2017 [employer].

## 2020-09-13 NOTE — Progress Notes (Signed)
Subjective:   Kathy Richardson is a 74 y.o. female who presents for Medicare Annual (Subsequent) preventive examination.  I connected with [name] today by telephone and verified that I am speaking with the correct person using two identifiers. Location patient: home Location provider: work Persons participating in the virtual visit: patient, Marine scientist.    I discussed the limitations, risks, security and privacy concerns of performing an evaluation and management service by telephone and the availability of in person appointments. I also discussed with the patient that there may be a patient responsible charge related to this service. The patient expressed understanding and verbally consented to this telephonic visit.    Interactive audio and video telecommunications were attempted between this provider and patient, however failed, due to patient having technical difficulties OR patient did not have access to video capability.  We continued and completed visit with audio only.  Some vital signs may be absent or patient reported.   Time Spent with patient on telephone encounter: 20 minutes  Review of Systems     Cardiac Risk Factors include: advanced age (>78men, >54 women)     Objective:    Today's Vitals   09/13/20 0901  Weight: 144 lb (65.3 kg)  Height: 5\' 2"  (1.575 m)   Body mass index is 26.34 kg/m.  Advanced Directives 09/13/2020 02/17/2018 02/03/2017 06/11/2015  Does Patient Have a Medical Advance Directive? Yes Yes Yes No  Type of Paramedic of [place];Living will [place];Living will [place];Living will -  Copy of [place] in Chart? No - copy requested No - copy requested No - copy requested -    Current Medications (verified) Outpatient Encounter Medications as of 09/13/2020  Medication Sig  . atorvastatin (LIPITOR) 20 MG tablet Take 1 tablet (20 mg total) by mouth daily.  . irbesartan  (AVAPRO) 75 MG tablet 1/2 tab po bid  . LORazepam (ATIVAN) 0.5 MG tablet TAKE 1 TO 2 TABLETS BY MOUTH TWICE A DAY AS NEEDED FOR ANXIETY  . meclizine (ANTIVERT) 25 MG tablet Take 1 tablet (25 mg total) by mouth 3 (three) times daily as needed for dizziness.  Marland Kitchen PARoxetine (PAXIL) 10 MG tablet Take 1 tablet (10 mg total) by mouth daily.  Marland Kitchen PARoxetine (PAXIL) 20 MG tablet TAKE 1 TABLET BY MOUTH EVERY DAY (Patient taking differently: Take 10 mg by mouth daily. )   No facility-administered encounter medications on file as of 09/13/2020.    Allergies (verified) Citalopram, Lisinopril, and Simvastatin   History: Past Medical History:  Diagnosis Date  . Allergic rhinitis   . Basal cell carcinoma    X 3: one on each leg and one on right shoulder  . Carpal tunnel syndrome on both sides    11/17/19 NCS/EMG; mild.  Dr. Fredna Dow  . Chronic renal insufficiency, stage II (mild)    GFR 60s  . Family history of colon cancer in father    Age 47.  Next colonoscopy due 04/2022.  Marland Kitchen Generalized anxiety disorder   . History of adenomatous polyp of colon 2008; 2013; 04/2017   2018--Recall 5 yrs (Dr. Megan Salon w/[facility])  . Hyperlipidemia, mixed    simva 2016; atorva 05/2019  . Hypertension   . Mitral valve prolapse   . Osteoarthritis    Fingers and hips.  . Osteopenia    DEXA 03/2019. T-score -2.0-->rpt 2 yrs (Dr. Julien Girt)  . Palpitations    3d event monitor normal 08/26/19.  +Bradycardia and PVCs,  followed by cardiology (BB d/c'd 01/2020).   Past Surgical History:  Procedure Laterality Date  . ABDOMINAL HYSTERECTOMY  1979   Done for DUB.  Ovaries are still in.  No further paps necessary.  . APPENDECTOMY  1979  . CARDIOVASCULAR STRESS TEST  2007   Normal stress echo (in [place])  . CATARACT EXTRACTION  07/2013  . COLONOSCOPY  12/2011   Recall 5 yrs  . COLONOSCOPY  04/21/2017   Tubular adenoma x 1. Repeat in 5 years (Dr. [facility])  . DEXA  02/2017; 03/2019   03/2019 T-score -2.0.  Repeat 2 yrs (Dr.  Orvan Se[facility]).  . Event monitor  08/26/2019   74 H->no adverse arrhythmias.  Occ PVC and PAT.  Marland Kitchen TRANSTHORACIC ECHOCARDIOGRAM  08/05/2019   EF 65-70%, grd I DD, mild MR.   Family History  Problem Relation Age of Onset  . Breast cancer Mother        dx'd age 39  . Heart attack Mother   . Heart disease Mother   . Stroke Father   . Hypertension Father   . Cancer Father   . Hypertension Sister   . Cancer Brother   . Hypertension Brother   . Heart attack Brother   . Pancreatitis Brother   . Hypertension Sister   . Myasthenia gravis Sister    Social History   Socioeconomic History  . Marital status: Married    Spouse name: Not on file  . Number of children: Not on file  . Years of education: Not on file  . Highest education level: Not on file  Occupational History  . Not on file  Tobacco Use  . Smoking status: Never Smoker  . Smokeless tobacco: Never Used  Vaping Use  . Vaping Use: Never used  Substance and Sexual Activity  . Alcohol use: Yes    Alcohol/week: 1.0 standard drink    Types: 1 Glasses of wine per week    Comment: 4-5 times weekly  . Drug use: No  . Sexual activity: Not on file  Other Topics Concern  . Not on file  Social History Narrative   Married, 1 son, 1 grand-daughter.   Orig from [place], then [place] for 30 yrs, relocated back to [place].   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   NO tob.  Alc: 1 glass wine 4-5 times per week.   Social Determinants of Health   Financial Resource Strain: Low Risk   . Difficulty of Paying Living Expenses: Not hard at all  Food Insecurity: No Food Insecurity  . Worried About Charity fundraiser in the Last Year: Never true  . Ran Out of Food in the Last Year: Never true  Transportation Needs: No Transportation Needs  . Lack of Transportation (Medical): No  . Lack of Transportation (Non-Medical): No  Physical Activity: Sufficiently Active  . Days of Exercise  per Week: 3 days  . Minutes of Exercise per Session: 60 min  Stress: No Stress Concern Present  . Feeling of Stress : Not at all  Social Connections: Moderately Integrated  . Frequency of Communication with Friends and Family: More than three times a week  . Frequency of Social Gatherings with Friends and Family: More than three times a week  . Attends Religious Services: More than 4 times per year  . Active Member of Clubs or Organizations: No  . Attends Archivist Meetings: Never  .  Marital Status: Married    Tobacco Counseling Counseling given: Not Answered   Clinical Intake:  Pre-visit preparation completed: Yes  Pain : No/denies pain     Nutritional Status: BMI 25 -29 Overweight Nutritional Risks: None Diabetes: No  How often do you need to have someone help you when you read instructions, pamphlets, or other written materials from your doctor or pharmacy?: 1 - Never What is the last grade level you completed in school?: 12th grade  Diabetic?No  Interpreter Needed?: No  Information entered by :: Caroleen Hamman LPN   Activities of Daily Living In your present state of health, do you have any difficulty performing the following activities: 09/13/2020  Hearing? N  Vision? N  Difficulty concentrating or making decisions? N  Walking or climbing stairs? N  Dressing or bathing? N  Doing errands, shopping? N  Preparing Food and eating ? N  Using the Toilet? N  In the past six months, have you accidently leaked urine? N  Do you have problems with loss of bowel control? N  Managing your Medications? N  Managing your Finances? N  Housekeeping or managing your Housekeeping? N  Some recent data might be hidden    Patient Care Team: Tammi Sou, MD as PCP - General (Family Medicine) [place], Jennefer Bravo, MD as Consulting Physician (Dermatology) Jola Schmidt, MD as Consulting Physician (Ophthalmology) [name], MD as Consulting Physician  (Obstetrics and Gynecology) Ronnette Juniper, MD as Consulting Physician (Gastroenterology) Marrion Coy, MD as Referring Physician (Chiropractic Medicine) Leanora Cover, MD as Consulting Physician (Orthopedic Surgery)  Indicate any recent Medical Services you may have received from other than [facility] providers in the past year (date may be approximate).     Assessment:   This is a routine wellness examination for Kathy Richardson.  Hearing/Vision screen  Hearing Screening   125Hz  250Hz  500Hz  1000Hz  2000Hz  3000Hz  4000Hz  6000Hz  8000Hz   Right ear:           Left ear:           Comments: No issues  Vision Screening Comments: Reading glasses Last eye exam-01/2020-Dr. Eulas Post  Dietary issues and exercise activities discussed: Current Exercise Habits: Home exercise routine, Type of exercise: walking, Time (Minutes): 60, Frequency (Times/Week): 3, Weekly Exercise (Minutes/Week): 180, Intensity: Mild, Exercise limited by: None identified  Goals    . Patient Stated     Maintain current health by staying active.       Depression Screen PHQ 2/9 Scores 09/13/2020 07/30/2019 06/04/2019 06/04/2019 06/04/2019 09/15/2018 03/12/2018  PHQ - 2 Score 0 0 2 1 2  0 0  PHQ- 9 Score - 0 6 4 6  0 -    Fall Risk Fall Risk  09/13/2020 09/15/2018 03/12/2018 02/17/2018 02/03/2017  Falls in the past year? 1 0 No No No  Number falls in past yr: 0 - - - -  Injury with Fall? 0 - - - -  Follow up Falls prevention discussed Education provided;Falls evaluation completed - - -    Any stairs in or around the home? Yes  If so, are there any without handrails? No  Home free of loose throw rugs in walkways, pet beds, electrical cords, etc? Yes  Adequate lighting in your home to reduce risk of falls? Yes   ASSISTIVE DEVICES UTILIZED TO PREVENT FALLS:  Life alert? No  Use of a cane, walker or w/c? No  Grab bars in the bathroom? No  Shower chair or bench in shower? No  Elevated toilet seat or  a handicapped toilet? No   TIMED UP AND GO:  Was  the test performed? No . Phone visit   Cognitive Function:No cognitive impairment noted MMSE - Mini Mental State Exam 02/17/2018  Orientation to time 5  Orientation to Place 5  Registration 3  Attention/ Calculation 5  Recall 3  Language- name 2 objects 2  Language- repeat 1  Language- follow 3 step command 3  Language- read & follow direction 1  Write a sentence 1  Copy design 1  Total score 30        Immunizations Immunization History  Administered Date(s) Administered  . Fluad Quad(high Dose 65+) 07/29/2019  . Influenza, High Dose Seasonal PF 09/01/2018, 07/29/2019  . Influenza-Unspecified 08/08/2014, 08/16/2016, 08/26/2019  . PFIZER SARS-COV-2 Vaccination 12/06/2019, 01/03/2020, 08/07/2020  . Pneumococcal Polysaccharide-23 09/27/2019  . Pneumococcal-Unspecified 09/27/2019  . Tdap 11/30/2014  . Zoster 11/12/2018  . Zoster Recombinat (Shingrix) 07/02/2018, 11/12/2018    TDAP status: Up to date   Flu vaccine status: Due- patient plans on getting vaccine later today.  Pneumococcal vaccine status: Up to date   Covid-19 vaccine status: Completed vaccines  Qualifies for Shingles Vaccine? No   Zostavax completed Yes   Shingrix Completed?: Yes  Screening Tests Health Maintenance  Topic Date Due  . INFLUENZA VACCINE  06/11/2020  . PNA vac Low Risk Adult (2 of 2 - PCV13) 09/26/2020  . MAMMOGRAM  05/05/2021  . COLONOSCOPY  04/21/2022  . TETANUS/TDAP  11/30/2024  . DEXA SCAN  Completed  . COVID-19 Vaccine  Completed  . Hepatitis C Screening  Discontinued    Health Maintenance  Health Maintenance Due  Topic Date Due  . INFLUENZA VACCINE  06/11/2020    Colorectal cancer screening: Completed Colonoscopy 04/21/2017. Repeat every 5 years   Mammogram status: Completed Bilateral-04/2020 at GYN office. Repeat every year  Requested patient have a copy of results sent to PCP  Bone Density status: Completed 04/26/2019. Results reflect: Bone density results: OSTEOPENIA.  Repeat every 2 years.    Lung Cancer Screening: (Low Dose CT Chest recommended if Age 75-80 years, 30 pack-year currently smoking OR have quit w/in 15years.) does not qualify.     Additional Screening:  Hepatitis C Screening: does not qualify  Vision Screening: Recommended annual ophthalmology exams for early detection of glaucoma and other disorders of the eye. Is the patient up to date with their annual eye exam?  Yes  Who is the provider or what is the name of the office in which the patient attends annual eye exams? Dr. Eulas Post   Dental Screening: Recommended annual dental exams for proper oral hygiene  Community Resource Referral / Chronic Care Management: CRR required this visit?  No   CCM required this visit?  No      Plan:     I have personally reviewed and noted the following in the patient's chart:   . Medical and social history . Use of alcohol, tobacco or illicit drugs  . Current medications and supplements . Functional ability and status . Nutritional status . Physical activity . Advanced directives . List of other physicians . Hospitalizations, surgeries, and ER visits in previous 12 months . Vitals . Screenings to include cognitive, depression, and falls . Referrals and appointments  In addition, I have reviewed and discussed with patient certain preventive protocols, quality metrics, and best practice recommendations. A written personalized care plan for preventive services as well as general preventive health recommendations were provided to patient.   Due to  this being a telephonic visit, the after visit summary with patients personalized plan was offered to patient via mail or my-chart. Patient would like to access on my-chart.   Marta Antu, LPN   01/0/2725  Nurse Health Advisor  Nurse Notes: None

## 2020-09-27 ENCOUNTER — Other Ambulatory Visit: Payer: Self-pay

## 2020-09-27 DIAGNOSIS — E7800 Pure hypercholesterolemia, unspecified: Secondary | ICD-10-CM | POA: Insufficient documentation

## 2020-09-28 ENCOUNTER — Other Ambulatory Visit: Payer: Self-pay

## 2020-09-29 ENCOUNTER — Encounter: Payer: Self-pay | Admitting: Family Medicine

## 2020-09-29 ENCOUNTER — Ambulatory Visit (INDEPENDENT_AMBULATORY_CARE_PROVIDER_SITE_OTHER): Payer: Medicare HMO | Admitting: Family Medicine

## 2020-09-29 VITALS — BP 125/73 | HR 54 | Temp 97.9°F | Resp 16 | Ht 62.0 in | Wt 144.4 lb

## 2020-09-29 DIAGNOSIS — N3000 Acute cystitis without hematuria: Secondary | ICD-10-CM | POA: Diagnosis not present

## 2020-09-29 DIAGNOSIS — I10 Essential (primary) hypertension: Secondary | ICD-10-CM | POA: Diagnosis not present

## 2020-09-29 DIAGNOSIS — R69 Illness, unspecified: Secondary | ICD-10-CM | POA: Diagnosis not present

## 2020-09-29 DIAGNOSIS — E7800 Pure hypercholesterolemia, unspecified: Secondary | ICD-10-CM

## 2020-09-29 DIAGNOSIS — Z0000 Encounter for general adult medical examination without abnormal findings: Secondary | ICD-10-CM

## 2020-09-29 DIAGNOSIS — R350 Frequency of micturition: Secondary | ICD-10-CM

## 2020-09-29 DIAGNOSIS — Z23 Encounter for immunization: Secondary | ICD-10-CM

## 2020-09-29 DIAGNOSIS — F411 Generalized anxiety disorder: Secondary | ICD-10-CM | POA: Diagnosis not present

## 2020-09-29 LAB — POCT URINALYSIS DIPSTICK
Bilirubin, UA: POSITIVE
Blood, UA: NEGATIVE
Glucose, UA: NEGATIVE
Ketones, UA: NEGATIVE
Nitrite, UA: NEGATIVE
Protein, UA: POSITIVE — AB
Spec Grav, UA: 1.02 (ref 1.010–1.025)
Urobilinogen, UA: 1 E.U./dL
pH, UA: 6 (ref 5.0–8.0)

## 2020-09-29 MED ORDER — LORAZEPAM 0.5 MG PO TABS
ORAL_TABLET | ORAL | 1 refills | Status: DC
Start: 2020-09-29 — End: 2021-04-13

## 2020-09-29 MED ORDER — SULFAMETHOXAZOLE-TRIMETHOPRIM 800-160 MG PO TABS: 1.0000 | ORAL_TABLET | Freq: Two times a day (BID) | ORAL | 0 refills | Status: DC

## 2020-09-29 NOTE — Patient Instructions (Signed)
Health Maintenance, Female Adopting a healthy lifestyle and getting preventive care are important in promoting health and wellness. Ask your health care provider about:  The right schedule for you to have regular tests and exams.  Things you can do on your own to prevent diseases and keep yourself healthy. What should I know about diet, weight, and exercise? Eat a healthy diet   Eat a diet that includes plenty of vegetables, fruits, low-fat dairy products, and lean protein.  Do not eat a lot of foods that are high in solid fats, added sugars, or sodium. Maintain a healthy weight Body mass index (BMI) is used to identify weight problems. It estimates body fat based on height and weight. Your health care provider can help determine your BMI and help you achieve or maintain a healthy weight. Get regular exercise Get regular exercise. This is one of the most important things you can do for your health. Most adults should:  Exercise for at least 150 minutes each week. The exercise should increase your heart rate and make you sweat (moderate-intensity exercise).  Do strengthening exercises at least twice a week. This is in addition to the moderate-intensity exercise.  Spend less time sitting. Even light physical activity can be beneficial. Watch cholesterol and blood lipids Have your blood tested for lipids and cholesterol at 72 years of age, then have this test every 5 years. Have your cholesterol levels checked more often if:  Your lipid or cholesterol levels are high.  You are older than 72 years of age.  You are at high risk for heart disease. What should I know about cancer screening? Depending on your health history and family history, you may need to have cancer screening at various ages. This may include screening for:  Breast cancer.  Cervical cancer.  Colorectal cancer.  Skin cancer.  Lung cancer. What should I know about heart disease, diabetes, and high blood  pressure? Blood pressure and heart disease  High blood pressure causes heart disease and increases the risk of stroke. This is more likely to develop in people who have high blood pressure readings, are of African descent, or are overweight.  Have your blood pressure checked: ? Every 3-5 years if you are 18-39 years of age. ? Every year if you are 40 years old or older. Diabetes Have regular diabetes screenings. This checks your fasting blood sugar level. Have the screening done:  Once every three years after age 40 if you are at a normal weight and have a low risk for diabetes.  More often and at a younger age if you are overweight or have a high risk for diabetes. What should I know about preventing infection? Hepatitis B If you have a higher risk for hepatitis B, you should be screened for this virus. Talk with your health care provider to find out if you are at risk for hepatitis B infection. Hepatitis C Testing is recommended for:  Everyone born from 1945 through 1965.  Anyone with known risk factors for hepatitis C. Sexually transmitted infections (STIs)  Get screened for STIs, including gonorrhea and chlamydia, if: ? You are sexually active and are younger than 72 years of age. ? You are older than 72 years of age and your health care provider tells you that you are at risk for this type of infection. ? Your sexual activity has changed since you were last screened, and you are at increased risk for chlamydia or gonorrhea. Ask your health care provider if   you are at risk.  Ask your health care provider about whether you are at high risk for HIV. Your health care provider may recommend a prescription medicine to help prevent HIV infection. If you choose to take medicine to prevent HIV, you should first get tested for HIV. You should then be tested every 3 months for as long as you are taking the medicine. Pregnancy  If you are about to stop having your period (premenopausal) and  you may become pregnant, seek counseling before you get pregnant.  Take 400 to 800 micrograms (mcg) of folic acid every day if you become pregnant.  Ask for birth control (contraception) if you want to prevent pregnancy. Osteoporosis and menopause Osteoporosis is a disease in which the bones lose minerals and strength with aging. This can result in bone fractures. If you are 65 years old or older, or if you are at risk for osteoporosis and fractures, ask your health care provider if you should:  Be screened for bone loss.  Take a calcium or vitamin D supplement to lower your risk of fractures.  Be given hormone replacement therapy (HRT) to treat symptoms of menopause. Follow these instructions at home: Lifestyle  Do not use any products that contain nicotine or tobacco, such as cigarettes, e-cigarettes, and chewing tobacco. If you need help quitting, ask your health care provider.  Do not use street drugs.  Do not share needles.  Ask your health care provider for help if you need support or information about quitting drugs. Alcohol use  Do not drink alcohol if: ? Your health care provider tells you not to drink. ? You are pregnant, may be pregnant, or are planning to become pregnant.  If you drink alcohol: ? Limit how much you use to 0-1 drink a day. ? Limit intake if you are breastfeeding.  Be aware of how much alcohol is in your drink. In the [place] one drink equals one 12 oz bottle of beer (355 mL), one 5 oz glass of wine (148 mL), or one 1 oz glass of hard liquor (44 mL). General instructions  Schedule regular health, dental, and eye exams.  Stay current with your vaccines.  Tell your health care provider if: ? You often feel depressed. ? You have ever been abused or do not feel safe at home. Summary  Adopting a healthy lifestyle and getting preventive care are important in promoting health and wellness.  Follow your health care provider's instructions about healthy  diet, exercising, and getting tested or screened for diseases.  Follow your health care provider's instructions on monitoring your cholesterol and blood pressure. This information is not intended to replace advice given to you by your health care provider. Make sure you discuss any questions you have with your health care provider. Document Revised: 10/21/2018 Document Reviewed: 10/21/2018 Elsevier Patient Education  2020 Elsevier Inc.  

## 2020-09-29 NOTE — Progress Notes (Signed)
Office Note 09/29/2020  CC:  Chief Complaint  Patient presents with  . Annual Exam    pt is fasting    HPI:  Kathy Richardson is a 74 y.o. White female who is here for annual health maintenance exam and f/u HTN, HLD, CRI, and GAD. Since last visit she went to cardiology: carotid ultrasound done and completely normal.  HTN: home bp's normal.  Walking 3-4 miles almost every day. Working on cutting back on starches.  HLD: tolerating statin well.  Taking 2 otc fish oil tabs daily.  GAD: taking paxil daily, this helps well. Takes lorazepam sparingly.  Acute c/o: urinary frequency and urgency w/out dysuria x 1 wk. No blood, no flank pain, no nausea.    PMP AWARE reviewed today: most recent rx for lorazepam 0.5mg  was filled 07/28/20, # 24, rx by me. No red flags.  Past Medical History:  Diagnosis Date  . Allergic rhinitis   . Basal cell carcinoma    X 3: one on each leg and one on right shoulder  . Carpal tunnel syndrome on both sides    11/17/19 NCS/EMG; mild.  Dr. Fredna Dow  . Family history of colon cancer in father    Age 48.  Next colonoscopy due 04/2022.  Marland Kitchen Generalized anxiety disorder   . History of adenomatous polyp of colon 2008; 2013; 04/2017   2018--Recall 5 yrs (Dr. Megan Salon w/[facility])  . Hyperlipidemia, mixed    simva 2016; atorva 05/2019  . Hypertension   . Mitral valve prolapse   . Osteoarthritis    Fingers and hips.  . Osteopenia    DEXA 03/2019. T-score -2.0-->rpt 2 yrs (Dr. Julien Girt)  . Palpitations    3d event monitor normal 08/26/19.  +Bradycardia and PVCs, followed by cardiology (BB d/c'd 01/2020).    Past Surgical History:  Procedure Laterality Date  . ABDOMINAL HYSTERECTOMY  1979   Done for DUB.  Ovaries are still in.  No further paps necessary.  . APPENDECTOMY  1979  . CARDIOVASCULAR STRESS TEST  2007   Normal stress echo (in [place])  . CAROTID DOPPLERS  08/11/2020   normal (cards, Dr. Elonda Husky)  . CATARACT EXTRACTION  07/2013  . COLONOSCOPY  12/2011    Recall 5 yrs  . COLONOSCOPY  04/21/2017   Tubular adenoma x 1. Repeat in 5 years (Dr. [facility])  . DEXA  02/2017; 03/2019   03/2019 T-score -2.0.  Repeat 2 yrs (Dr. Orvan Seen, [facility]).  . Event monitor  08/26/2019   74 H->no adverse arrhythmias.  Occ PVC and PAT.  Marland Kitchen TRANSTHORACIC ECHOCARDIOGRAM  08/05/2019   EF 65-70%, grd I DD, mild MR.    Family History  Problem Relation Age of Onset  . Breast cancer Mother        dx'd age 83  . Heart attack Mother   . Heart disease Mother   . Stroke Father   . Hypertension Father   . Cancer Father   . Hypertension Sister   . Cancer Brother   . Hypertension Brother   . Heart attack Brother   . Pancreatitis Brother   . Hypertension Sister   . Myasthenia gravis Sister     Social History   Socioeconomic History  . Marital status: Married    Spouse name: Not on file  . Number of children: Not on file  . Years of education: Not on file  . Highest education level: Not on file  Occupational History  . Not on file  Tobacco Use  . Smoking status: Never Smoker  . Smokeless tobacco: Never Used  Vaping Use  . Vaping Use: Never used  Substance and Sexual Activity  . Alcohol use: Yes    Alcohol/week: 1.0 standard drink    Types: 1 Glasses of wine per week    Comment: 4-5 times weekly  . Drug use: No  . Sexual activity: Not on file  Other Topics Concern  . Not on file  Social History Narrative   Married, 1 son, 1 grand-daughter.   Orig from [place], then [place] for 30 yrs, relocated back to [place].   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   NO tob.  Alc: 1 glass wine 4-5 times per week.   Social Determinants of Health   Financial Resource Strain: Low Risk   . Difficulty of Paying Living Expenses: Not hard at all  Food Insecurity: No Food Insecurity  . Worried About Charity fundraiser in the Last Year: Never true  . Ran Out of Food in the Last Year: Never true  Transportation  Needs: No Transportation Needs  . Lack of Transportation (Medical): No  . Lack of Transportation (Non-Medical): No  Physical Activity: Sufficiently Active  . Days of Exercise per Week: 3 days  . Minutes of Exercise per Session: 60 min  Stress: No Stress Concern Present  . Feeling of Stress : Not at all  Social Connections: Moderately Integrated  . Frequency of Communication with Friends and Family: More than three times a week  . Frequency of Social Gatherings with Friends and Family: More than three times a week  . Attends Religious Services: More than 4 times per year  . Active Member of Clubs or Organizations: No  . Attends Archivist Meetings: Never  . Marital Status: Married  Human resources officer Violence: Not At Risk  . Fear of Current or Ex-Partner: No  . Emotionally Abused: No  . Physically Abused: No  . Sexually Abused: No    Outpatient Medications Prior to Visit  Medication Sig Dispense Refill  . atorvastatin (LIPITOR) 20 MG tablet Take 1 tablet (20 mg total) by mouth daily. 90 tablet 3  . irbesartan (AVAPRO) 75 MG tablet 1/2 tab po bid 90 tablet 3  . LORazepam (ATIVAN) 0.5 MG tablet TAKE 1 TO 2 TABLETS BY MOUTH TWICE A DAY AS NEEDED FOR ANXIETY 60 tablet 1  . PARoxetine (PAXIL) 10 MG tablet Take 1 tablet (10 mg total) by mouth daily. 90 tablet 3  . meclizine (ANTIVERT) 25 MG tablet Take 1 tablet (25 mg total) by mouth 3 (three) times daily as needed for dizziness. (Patient not taking: Reported on 09/29/2020) 30 tablet 0   No facility-administered medications prior to visit.    Allergies  Allergen Reactions  . Citalopram Other (See Comments)    Heatwaves/hot flashes  . Lisinopril Other (See Comments)    Jaw pain  . Simvastatin Other (See Comments)    Orthostatic dizziness    ROS Review of Systems  Constitutional: Negative for appetite change, chills, fatigue and fever.  HENT: Negative for congestion, dental problem, ear pain and sore throat.   Eyes:  Negative for discharge, redness and visual disturbance.  Respiratory: Negative for cough, chest tightness, shortness of breath and wheezing.   Cardiovascular: Negative for chest pain, palpitations and leg swelling.  Gastrointestinal: Negative for abdominal pain, blood in stool, diarrhea, nausea and vomiting.  Genitourinary: Positive for frequency and urgency.  Negative for difficulty urinating, dysuria, flank pain and hematuria.  Musculoskeletal: Negative for arthralgias, back pain, joint swelling, myalgias and neck stiffness.  Skin: Negative for pallor and rash.  Neurological: Negative for dizziness, speech difficulty, weakness and headaches.  Hematological: Negative for adenopathy. Does not bruise/bleed easily.  Psychiatric/Behavioral: Negative for confusion and sleep disturbance. The patient is not nervous/anxious.    PE; Vitals with BMI 09/29/2020 09/13/2020 03/27/2020  Height 5\' 2"  5\' 2"  5\' 2"   Weight 144 lbs 6 oz 144 lbs 144 lbs 6 oz  BMI 26.4 46.56 81.2  Systolic 751 - 700  Diastolic 73 - 70  Pulse 54 - 55   Exam chaperoned by Deveron Furlong, CMA.  Gen: Alert, well appearing.  Patient is oriented to person, place, time, and situation. AFFECT: pleasant, lucid thought and speech. ENT: Ears: EACs clear, normal epithelium.  TMs with good light reflex and landmarks bilaterally.  Eyes: no injection, icteris, swelling, or exudate.  EOMI, PERRLA. Nose: no drainage or turbinate edema/swelling.  No injection or focal lesion.  Mouth: lips without lesion/swelling.  Oral mucosa pink and moist.  Dentition intact and without obvious caries or gingival swelling.  Oropharynx without erythema, exudate, or swelling.  Neck: supple/nontender.  No LAD, mass, or TM.  Carotid pulses 2+ bilaterally, without bruits. CV: RRR, no m/r/g.   LUNGS: CTA bilat, nonlabored resps, good aeration in all lung fields. ABD: soft, NT, ND, BS normal.  No hepatospenomegaly or mass.  No bruits. EXT: no clubbing, cyanosis,  or edema.  Musculoskeletal: no joint swelling, erythema, warmth, or tenderness.  ROM of all joints intact. Skin - no sores or suspicious lesions or rashes or color changes   Pertinent labs:  Lab Results  Component Value Date   TSH 1.84 07/30/2019   Lab Results  Component Value Date   WBC 5.5 09/27/2019   HGB 15.1 09/27/2019   HCT 44.0 09/27/2019   MCV 87.0 09/27/2019   PLT 216 09/27/2019   Lab Results  Component Value Date   CREATININE 0.89 03/27/2020   BUN 14 03/27/2020   NA 139 03/27/2020   K 4.6 03/27/2020   CL 103 03/27/2020   CO2 25 03/27/2020   Lab Results  Component Value Date   ALT 21 06/12/2020   AST 20 06/12/2020   ALKPHOS 82 06/12/2020   BILITOT 1.2 06/12/2020   Lab Results  Component Value Date   CHOL 158 06/12/2020   Lab Results  Component Value Date   HDL 40.90 06/12/2020   Lab Results  Component Value Date   LDLCALC 78 06/12/2020   Lab Results  Component Value Date   TRIG 199.0 (H) 06/12/2020   Lab Results  Component Value Date   CHOLHDL 4 06/12/2020   CC dipstick UA today: 2+ leuks, otherwise neg  ASSESSMENT AND PLAN:   1) HTN: well controlled. Cont irbesartan 1/2 75mg  tab bid. Lytes/cr today.  2) HLD: tolerating statin (atorva 20 qd). Continue this and check flp and hepatic panel today.  3) GAD: stable.  Cont paxil and prn lorazepam. CSC UTD. Erx'd lorazepam 05mg , 1 bid prn, #60, RF x 1 today.  4) UTI: bactriM DS 1 BID X 3d rx'd today. Urine sent for c/s.  5) Health maintenance exam: Reviewed age and gender appropriate health maintenance issues (prudent diet, regular exercise, health risks of tobacco and excessive alcohol, use of seatbelts, fire alarms in home, use of sunscreen).  Also reviewed age and gender appropriate health screening as well as vaccine recommendations. Vaccines:  Prevnar 13 today.  Otherwise vaccines are all UTD. Labs: fasting HP labs Cervical ca screening: remote hx of hysterectomy for DUB, no further  paps indicated. Breast ca screening: [facility], [facility]. Colon ca screening: hx of adenomas, recall 04/2022. Osteoporosis screening: last DEXA 04/2019, osteopenia, [facility]->rpt approx 2022/2023.  She is taking 1000 U vit D daily.  An After Visit Summary was printed and given to the patient.  FOLLOW UP:  Return in about 6 months (around 03/29/2021) for routine chronic illness f/u.  Signed:  Crissie Sickles, MD           09/29/2020

## 2020-09-30 LAB — COMPREHENSIVE METABOLIC PANEL
AG Ratio: 1.8 (calc) (ref 1.0–2.5)
ALT: 19 U/L (ref 6–29)
AST: 18 U/L (ref 10–35)
Albumin: 4.1 g/dL (ref 3.6–5.1)
Alkaline phosphatase (APISO): 70 U/L (ref 37–153)
BUN: 14 mg/dL (ref 7–25)
CO2: 24 mmol/L (ref 20–32)
Calcium: 9.3 mg/dL (ref 8.6–10.4)
Chloride: 103 mmol/L (ref 98–110)
Creat: 0.85 mg/dL (ref 0.60–0.93)
Globulin: 2.3 g/dL (calc) (ref 1.9–3.7)
Glucose, Bld: 90 mg/dL (ref 65–99)
Potassium: 4.4 mmol/L (ref 3.5–5.3)
Sodium: 138 mmol/L (ref 135–146)
Total Bilirubin: 0.9 mg/dL (ref 0.2–1.2)
Total Protein: 6.4 g/dL (ref 6.1–8.1)

## 2020-09-30 LAB — CBC WITH DIFFERENTIAL/PLATELET
Absolute Monocytes: 449 cells/uL (ref 200–950)
Basophils Absolute: 41 cells/uL (ref 0–200)
Basophils Relative: 0.8 %
Eosinophils Absolute: 230 cells/uL (ref 15–500)
Eosinophils Relative: 4.5 %
HCT: 41.3 % (ref 35.0–45.0)
Hemoglobin: 14.2 g/dL (ref 11.7–15.5)
Lymphs Abs: 1489 cells/uL (ref 850–3900)
MCH: 30.6 pg (ref 27.0–33.0)
MCHC: 34.4 g/dL (ref 32.0–36.0)
MCV: 89 fL (ref 80.0–100.0)
MPV: 9.4 fL (ref 7.5–12.5)
Monocytes Relative: 8.8 %
Neutro Abs: 2892 cells/uL (ref 1500–7800)
Neutrophils Relative %: 56.7 %
Platelets: 195 10*3/uL (ref 140–400)
RBC: 4.64 10*6/uL (ref 3.80–5.10)
RDW: 12.3 % (ref 11.0–15.0)
Total Lymphocyte: 29.2 %
WBC: 5.1 10*3/uL (ref 3.8–10.8)

## 2020-09-30 LAB — LIPID PANEL
Cholesterol: 153 mg/dL (ref <200)
HDL: 42 mg/dL — ABNORMAL LOW (ref >=50)
LDL Cholesterol (Calc): 89 mg/dL (calc)
Non-HDL Cholesterol (Calc): 111 mg/dL (calc) (ref <130)
Total CHOL/HDL Ratio: 3.6 (calc) (ref <5.0)
Triglycerides: 121 mg/dL (ref <150)

## 2020-09-30 LAB — TSH: TSH: 2.26 mIU/L (ref 0.40–4.50)

## 2020-10-01 LAB — URINE CULTURE
MICRO NUMBER:: 11226986
SPECIMEN QUALITY:: ADEQUATE

## 2020-10-02 ENCOUNTER — Other Ambulatory Visit: Payer: Self-pay

## 2021-03-29 ENCOUNTER — Ambulatory Visit: Payer: Medicare HMO | Admitting: Family Medicine

## 2021-04-12 NOTE — Progress Notes (Signed)
OFFICE VISIT  04/13/2021  CC:  Chief Complaint  Patient presents with  . Follow-up    6 month f/u: [facility] **PT IS FASTING**   HPI:    Patient is a 75 y.o. Caucasian female who presents for 6 mo f/u HTN, HLD, GAD. A/P as of last visit: "1) HTN: well controlled. Cont irbesartan 1/2 75mg  tab bid. Lytes/cr today.  2) HLD: tolerating statin (atorva 20 qd). Continue this and check flp and hepatic panel today.  3) GAD: stable.  Cont paxil and prn lorazepam. CSC UTD. Erx'd lorazepam 05mg , 1 bid prn, #60, RF x 1 today.  4) UTI: bactriM DS 1 BID X 3d rx'd today. Urine sent for c/s.  5) Health maintenance exam: Reviewed age and gender appropriate health maintenance issues (prudent diet, regular exercise, health risks of tobacco and excessive alcohol, use of seatbelts, fire alarms in home, use of sunscreen).  Also reviewed age and gender appropriate health screening as well as vaccine recommendations. Vaccines:  Prevnar 13 today.  Otherwise vaccines are all UTD. Labs: fasting HP labs Cervical ca screening: remote hx of hysterectomy for DUB, no further paps indicated. Breast ca screening: last mammogram [facility]/2020, [facility]->normal. Colon ca screening: hx of adenomas, recall 04/2022. Osteoporosis screening: last DEXA 04/2019, osteopenia, [facility]->rpt approx 2022/2023.  She is taking 1000 U vit D daily"  INTERIM HX: Feeling well. Golfing and walking.  HTN:  Home bp's consistently <130/80  HLD:  Atorvastatin 20mg  qd, no side effects.  GAD:  Higher anxiety with recent moving, building new house [place] but overall she feels pleased with how she's doing.  Taking paxil 10mg  qd. Only occ has to use lorazepam.  PMP AWARE reviewed today: most recent rx for lorazepam 0.5mg  was filled 01/11/21, # 68, rx by me. No red flags.  ROS as above, plus--> no fevers, no CP, no SOB, no wheezing, no cough, no dizziness, no HAs, no rashes, no  melena/hematochezia.  No polyuria or polydipsia.  No myalgias or arthralgias.  No focal weakness, paresthesias, or tremors.  No acute vision or hearing abnormalities.  No dysuria or unusual/new urinary urgency or frequency.  No recent changes in lower legs. No n/v/d or abd pain.  No palpitations.    Past Medical History:  Diagnosis Date  . Allergic rhinitis   . Basal cell carcinoma    X 3: one on each leg and one on right shoulder  . Carpal tunnel syndrome on both sides    11/17/19 NCS/EMG; mild.  Dr. Fredna Dow  . Family history of colon cancer in father    Age 85.  Next colonoscopy due 04/2022.  Marland Kitchen Generalized anxiety disorder   . History of adenomatous polyp of colon 2008; 2013; 04/2017   2018--Recall 5 yrs (Dr. Megan Salon w/[facility])  . Hyperlipidemia, mixed    simva 2016; atorva 05/2019  . Hypertension   . Mitral valve prolapse   . Osteoarthritis    Fingers and hips.  . Osteopenia    DEXA 03/2019. T-score -2.0-->rpt 2 yrs (Dr. Julien Girt)  . Palpitations    3d event monitor normal 08/26/19.  +Bradycardia and PVCs, followed by cardiology (BB d/c'd 01/2020).    Past Surgical History:  Procedure Laterality Date  . ABDOMINAL HYSTERECTOMY  1979   Done for DUB.  Ovaries are still in.  No further paps necessary.  . APPENDECTOMY  1979  . CARDIOVASCULAR STRESS TEST  2007   Normal stress echo (in [place])  . CAROTID  DOPPLERS  08/11/2020   normal (cards, Dr. Elonda Husky)  . CATARACT EXTRACTION  07/2013  . COLONOSCOPY  12/2011   Recall 5 yrs  . COLONOSCOPY  04/21/2017   Tubular adenoma x 1. Repeat in 5 years (Dr. [facility])  . DEXA  02/2017; 03/2019   03/2019 T-score -2.0.  Repeat 2 yrs (Dr. Or[facility] Se[facility], [facility]).  . Event monitor  08/26/2019   74 H->no adverse arrhythmias.  Occ PVC and PAT.  Marland Kitchen TRANSTHORACIC ECHOCARDIOGRAM  08/05/2019   EF 65-70%, grd I DD, mild MR.    Outpatient Medications Prior to Visit  Medication Sig Dispense Refill  . atorvastatin (LIPITOR) 20 MG tablet Take 1 tablet (20 mg  total) by mouth daily. 90 tablet 3  . irbesartan (AVAPRO) 75 MG tablet 1/2 tab po bid 90 tablet 3  . meclizine (ANTIVERT) 25 MG tablet Take 1 tablet (25 mg total) by mouth 3 (three) times daily as needed for dizziness. 30 tablet 0  . PARoxetine (PAXIL) 10 MG tablet Take 1 tablet (10 mg total) by mouth daily. 90 tablet 3  . LORazepam (ATIVAN) 0.5 MG tablet 1 tab po bid prn 60 tablet 1  . sulfamethoxazole-trimethoprim (BACTRIM DS) 800-160 MG tablet Take 1 tablet by mouth 2 (two) times daily. 6 tablet 0   No facility-administered medications prior to visit.    Allergies  Allergen Reactions  . Citalopram Other (See Comments)    Heatwaves/hot flashes  . Lisinopril Other (See Comments)    Jaw pain  . Simvastatin Other (See Comments)    Orthostatic dizziness    ROS As per HPI  PE: Vitals with BMI 04/13/2021 09/29/2020 09/13/2020  Height - 5\' 2"  5\' 2"   Weight 144 lbs 8 oz 144 lbs 6 oz 144 lbs  BMI - 08.6 76.19  Systolic 509 326 -  Diastolic 84 73 -  Pulse 60 54 -     Gen: Alert, well appearing.  Patient is oriented to person, place, time, and situation. AFFECT: pleasant, lucid thought and speech. CV: RRR, no m/r/g.   LUNGS: CTA bilat, nonlabored resps, good aeration in all lung fields. EXT: no clubbing or cyanosis.  no edema.    LABS:  Lab Results  Component Value Date   TSH 2.26 09/29/2020   Lab Results  Component Value Date   WBC 5.1 09/29/2020   HGB 14.2 09/29/2020   HCT 41.3 09/29/2020   MCV 89.0 09/29/2020   PLT 195 09/29/2020   Lab Results  Component Value Date   CREATININE 0.85 09/29/2020   BUN 14 09/29/2020   NA 138 09/29/2020   K 4.4 09/29/2020   CL 103 09/29/2020   CO2 24 09/29/2020   Lab Results  Component Value Date   ALT 19 09/29/2020   AST 18 09/29/2020   ALKPHOS 82 06/12/2020   BILITOT 0.9 09/29/2020   Lab Results  Component Value Date   CHOL 153 09/29/2020   Lab Results  Component Value Date   HDL 42 (L) 09/29/2020   Lab Results   Component Value Date   LDLCALC 89 09/29/2020   Lab Results  Component Value Date   TRIG 121 09/29/2020   Lab Results  Component Value Date   CHOLHDL 3.6 09/29/2020    IMPRESSION AND PLAN:  1) HTN: good control on 1/2 irbesartan 75mg  bid. Lytes/cr today.  2) HLD: Goal LDL <100.  Tolerating atorva 20mg  qd. Last LDL was 89 about 6 mo ago. FLP and hepatic panel today.  3) GAD:  doing well on paxil 10mg  qd and lorazepam 0.5mg  bid prn. New rx for lorazepam today, #60, RF x 1.  An After Visit Summary was printed and given to the patient.  FOLLOW UP: Return in about 6 months (around 10/13/2021) for annual CPE (fasting).  Signed:  Crissie Sickles, MD           04/13/2021

## 2021-04-13 ENCOUNTER — Encounter: Payer: Self-pay | Admitting: Family Medicine

## 2021-04-13 ENCOUNTER — Ambulatory Visit (INDEPENDENT_AMBULATORY_CARE_PROVIDER_SITE_OTHER): Payer: Medicare HMO | Admitting: Family Medicine

## 2021-04-13 ENCOUNTER — Other Ambulatory Visit: Payer: Self-pay

## 2021-04-13 VITALS — BP 140/84 | HR 60 | Temp 98.0°F | Wt 144.5 lb

## 2021-04-13 DIAGNOSIS — E7800 Pure hypercholesterolemia, unspecified: Secondary | ICD-10-CM

## 2021-04-13 DIAGNOSIS — F411 Generalized anxiety disorder: Secondary | ICD-10-CM

## 2021-04-13 DIAGNOSIS — I10 Essential (primary) hypertension: Secondary | ICD-10-CM

## 2021-04-13 DIAGNOSIS — R69 Illness, unspecified: Secondary | ICD-10-CM | POA: Diagnosis not present

## 2021-04-13 MED ORDER — LORAZEPAM 0.5 MG PO TABS
ORAL_TABLET | ORAL | 1 refills | Status: DC
Start: 2021-04-13 — End: 2022-01-16

## 2021-04-14 LAB — COMPREHENSIVE METABOLIC PANEL
AG Ratio: 1.9 (calc) (ref 1.0–2.5)
ALT: 20 U/L (ref 6–29)
AST: 19 U/L (ref 10–35)
Albumin: 4.2 g/dL (ref 3.6–5.1)
Alkaline phosphatase (APISO): 76 U/L (ref 37–153)
BUN: 11 mg/dL (ref 7–25)
CO2: 25 mmol/L (ref 20–32)
Calcium: 9.4 mg/dL (ref 8.6–10.4)
Chloride: 105 mmol/L (ref 98–110)
Creat: 0.79 mg/dL (ref 0.60–0.93)
Globulin: 2.2 g/dL (calc) (ref 1.9–3.7)
Glucose, Bld: 94 mg/dL (ref 65–99)
Potassium: 4.5 mmol/L (ref 3.5–5.3)
Sodium: 139 mmol/L (ref 135–146)
Total Bilirubin: 1 mg/dL (ref 0.2–1.2)
Total Protein: 6.4 g/dL (ref 6.1–8.1)

## 2021-04-14 LAB — LIPID PANEL
Cholesterol: 182 mg/dL (ref <200)
HDL: 47 mg/dL — ABNORMAL LOW (ref >=50)
LDL Cholesterol (Calc): 115 mg/dL (calc) — ABNORMAL HIGH
Non-HDL Cholesterol (Calc): 135 mg/dL (calc) — ABNORMAL HIGH (ref <130)
Total CHOL/HDL Ratio: 3.9 (calc) (ref <5.0)
Triglycerides: 101 mg/dL (ref <150)

## 2021-04-16 MED ORDER — ATORVASTATIN CALCIUM 40 MG PO TABS: 40.0000 mg | ORAL_TABLET | Freq: Every day | ORAL | 2 refills | Status: DC

## 2021-04-16 NOTE — Addendum Note (Signed)
Addended by: Octaviano Glow on: 04/16/2021 08:56 AM   Modules accepted: Orders

## 2021-05-05 DIAGNOSIS — Z23 Encounter for immunization: Secondary | ICD-10-CM | POA: Diagnosis not present

## 2021-06-15 ENCOUNTER — Other Ambulatory Visit: Payer: Self-pay | Admitting: Family Medicine

## 2021-06-21 ENCOUNTER — Other Ambulatory Visit: Payer: Self-pay | Admitting: Family Medicine

## 2021-06-27 DIAGNOSIS — H26493 Other secondary cataract, bilateral: Secondary | ICD-10-CM | POA: Diagnosis not present

## 2021-06-27 DIAGNOSIS — H04123 Dry eye syndrome of bilateral lacrimal glands: Secondary | ICD-10-CM | POA: Diagnosis not present

## 2021-06-27 DIAGNOSIS — H35373 Puckering of macula, bilateral: Secondary | ICD-10-CM | POA: Diagnosis not present

## 2021-06-27 DIAGNOSIS — H43812 Vitreous degeneration, left eye: Secondary | ICD-10-CM | POA: Diagnosis not present

## 2021-06-28 ENCOUNTER — Ambulatory Visit (INDEPENDENT_AMBULATORY_CARE_PROVIDER_SITE_OTHER): Payer: Medicare HMO

## 2021-06-28 ENCOUNTER — Other Ambulatory Visit: Payer: Self-pay

## 2021-06-28 DIAGNOSIS — R69 Illness, unspecified: Secondary | ICD-10-CM | POA: Diagnosis not present

## 2021-06-28 DIAGNOSIS — I10 Essential (primary) hypertension: Secondary | ICD-10-CM | POA: Diagnosis not present

## 2021-06-28 DIAGNOSIS — E7800 Pure hypercholesterolemia, unspecified: Secondary | ICD-10-CM

## 2021-06-28 DIAGNOSIS — F411 Generalized anxiety disorder: Secondary | ICD-10-CM

## 2021-06-28 LAB — LIPID PANEL
Cholesterol: 159 mg/dL (ref 0–200)
HDL: 49.4 mg/dL (ref >39.00)
LDL Cholesterol: 87 mg/dL (ref 0–99)
NonHDL: 109.14
Total CHOL/HDL Ratio: 3
Triglycerides: 111 mg/dL (ref 0.0–149.0)
VLDL: 22.2 mg/dL (ref 0.0–40.0)

## 2021-06-28 LAB — ALT: ALT: 20 U/L (ref 0–35)

## 2021-06-28 LAB — AST: AST: 20 U/L (ref 0–37)

## 2021-06-29 ENCOUNTER — Other Ambulatory Visit: Payer: Self-pay

## 2021-06-29 MED ORDER — ATORVASTATIN CALCIUM 40 MG PO TABS: 40.0000 mg | ORAL_TABLET | Freq: Every day | ORAL | 3 refills | Status: DC

## 2021-07-23 DIAGNOSIS — R0600 Dyspnea, unspecified: Secondary | ICD-10-CM | POA: Diagnosis not present

## 2021-07-23 DIAGNOSIS — E785 Hyperlipidemia, unspecified: Secondary | ICD-10-CM | POA: Diagnosis not present

## 2021-07-23 DIAGNOSIS — I10 Essential (primary) hypertension: Secondary | ICD-10-CM | POA: Diagnosis not present

## 2021-07-23 DIAGNOSIS — I493 Ventricular premature depolarization: Secondary | ICD-10-CM | POA: Diagnosis not present

## 2021-08-14 DIAGNOSIS — D0461 Carcinoma in situ of skin of right upper limb, including shoulder: Secondary | ICD-10-CM | POA: Diagnosis not present

## 2021-08-14 DIAGNOSIS — Z85828 Personal history of other malignant neoplasm of skin: Secondary | ICD-10-CM | POA: Diagnosis not present

## 2021-08-14 DIAGNOSIS — D485 Neoplasm of uncertain behavior of skin: Secondary | ICD-10-CM | POA: Diagnosis not present

## 2021-08-14 DIAGNOSIS — D225 Melanocytic nevi of trunk: Secondary | ICD-10-CM | POA: Diagnosis not present

## 2021-08-14 DIAGNOSIS — C44519 Basal cell carcinoma of skin of other part of trunk: Secondary | ICD-10-CM | POA: Diagnosis not present

## 2021-08-14 DIAGNOSIS — L570 Actinic keratosis: Secondary | ICD-10-CM | POA: Diagnosis not present

## 2021-08-14 DIAGNOSIS — L821 Other seborrheic keratosis: Secondary | ICD-10-CM | POA: Diagnosis not present

## 2021-08-14 DIAGNOSIS — L578 Other skin changes due to chronic exposure to nonionizing radiation: Secondary | ICD-10-CM | POA: Diagnosis not present

## 2021-09-10 DIAGNOSIS — N958 Other specified menopausal and perimenopausal disorders: Secondary | ICD-10-CM | POA: Diagnosis not present

## 2021-09-10 DIAGNOSIS — M8588 Other specified disorders of bone density and structure, other site: Secondary | ICD-10-CM | POA: Diagnosis not present

## 2021-09-10 DIAGNOSIS — Z6827 Body mass index (BMI) 27.0-27.9, adult: Secondary | ICD-10-CM | POA: Diagnosis not present

## 2021-09-10 DIAGNOSIS — Z1231 Encounter for screening mammogram for malignant neoplasm of breast: Secondary | ICD-10-CM | POA: Diagnosis not present

## 2021-09-10 DIAGNOSIS — Z01419 Encounter for gynecological examination (general) (routine) without abnormal findings: Secondary | ICD-10-CM | POA: Diagnosis not present

## 2021-09-10 DIAGNOSIS — Z8262 Family history of osteoporosis: Secondary | ICD-10-CM | POA: Diagnosis not present

## 2021-09-10 LAB — HM DEXA SCAN

## 2021-09-10 LAB — HM MAMMOGRAPHY

## 2021-09-12 ENCOUNTER — Telehealth: Payer: Self-pay | Admitting: Family Medicine

## 2021-09-12 DIAGNOSIS — C44622 Squamous cell carcinoma of skin of right upper limb, including shoulder: Secondary | ICD-10-CM | POA: Diagnosis not present

## 2021-09-12 NOTE — Telephone Encounter (Signed)
LVM 09/12/21 AWV time change 9am to 8:45am-khc

## 2021-09-16 ENCOUNTER — Other Ambulatory Visit: Payer: Self-pay | Admitting: Family Medicine

## 2021-09-19 ENCOUNTER — Ambulatory Visit: Payer: Medicare HMO

## 2021-09-26 ENCOUNTER — Ambulatory Visit: Payer: Medicare HMO

## 2021-10-10 DIAGNOSIS — L821 Other seborrheic keratosis: Secondary | ICD-10-CM | POA: Insufficient documentation

## 2021-10-10 DIAGNOSIS — D1801 Hemangioma of skin and subcutaneous tissue: Secondary | ICD-10-CM | POA: Insufficient documentation

## 2021-10-11 ENCOUNTER — Encounter: Payer: Self-pay | Admitting: Family Medicine

## 2021-10-11 ENCOUNTER — Other Ambulatory Visit: Payer: Self-pay

## 2021-10-11 ENCOUNTER — Ambulatory Visit (INDEPENDENT_AMBULATORY_CARE_PROVIDER_SITE_OTHER): Payer: Medicare HMO | Admitting: Family Medicine

## 2021-10-11 VITALS — BP 130/65 | HR 54 | Temp 97.7°F | Ht 62.0 in | Wt 146.0 lb

## 2021-10-11 DIAGNOSIS — Z0000 Encounter for general adult medical examination without abnormal findings: Secondary | ICD-10-CM | POA: Diagnosis not present

## 2021-10-11 DIAGNOSIS — F411 Generalized anxiety disorder: Secondary | ICD-10-CM

## 2021-10-11 DIAGNOSIS — R69 Illness, unspecified: Secondary | ICD-10-CM | POA: Diagnosis not present

## 2021-10-11 DIAGNOSIS — I10 Essential (primary) hypertension: Secondary | ICD-10-CM

## 2021-10-11 DIAGNOSIS — E7800 Pure hypercholesterolemia, unspecified: Secondary | ICD-10-CM | POA: Diagnosis not present

## 2021-10-11 MED ORDER — IRBESARTAN 75 MG PO TABS: ORAL_TABLET | ORAL | 3 refills | Status: DC

## 2021-10-11 MED ORDER — PAROXETINE HCL 10 MG PO TABS: 10.0000 mg | ORAL_TABLET | Freq: Every day | ORAL | 3 refills | Status: DC

## 2021-10-11 NOTE — Progress Notes (Signed)
Office Note 10/11/2021  CC:  Chief Complaint  Patient presents with   Annual Exam    Pt is fasting   HPI:  Patient is a 75 y.o. female who is here for annual health maintenance exam and 6 mo f/u HTN, HLD, GAD. A/P as of last visit: "1) HTN: good control on 1/2 irbesartan 75mg  bid. Lytes/cr today.   2) HLD: Goal LDL <100.  Tolerating atorva 20mg  qd. Last LDL was 89 about 6 mo ago. FLP and hepatic panel today.   3) GAD: doing well on paxil 10mg  qd and lorazepam 0.5mg  bid prn. New rx for lorazepam today, #60, RF x 1."  INTERIM HX: Kathy Richardson is doing great. She remains very active and eats healthy. Compliant with all medications.  Home blood pressures consistently less than 130/80.  She saw her GYN in October this year, all was well.  Mammogram negative.  Bone density scan stable osteopenia.  Thanksgiving holiday went well. Unfortunately her sister is struggling with health lately, Giant cell arteritis and chronic pelvic pain.    PMP AWARE reviewed today: most recent rx for lorazepam was filled 09/25/21, # 24, rx by me. No red flags.  Past Medical History:  Diagnosis Date   Allergic rhinitis    Basal cell carcinoma    X 3: one on each leg and one on right shoulder   Carpal tunnel syndrome on both sides    11/17/19 NCS/EMG; mild.  Dr. Fredna Dow   Family history of colon cancer in father    Age 28.  Next colonoscopy due 04/2022.   Generalized anxiety disorder    History of adenomatous polyp of colon 2008; 2013; 04/2017   2018--Recall 5 yrs (Dr. Megan Salon w/[facility])   Hyperlipidemia, mixed    simva 2016; atorva 05/2019   Hypertension    Mitral valve prolapse    Osteoarthritis    Fingers and hips.   Osteopenia    DEXA 03/2019. T-score -2.0-->rpt 2 yrs (Dr. Julien Girt)   Palpitations    3d event monitor normal 08/26/19.  +Bradycardia and PVCs, followed by cardiology (BB d/c'd 01/2020).    Past Surgical History:  Procedure Laterality Date   ABDOMINAL HYSTERECTOMY  1979   Done for DUB.   Ovaries are still in.  No further paps necessary.   Jal TEST  2007   Normal stress echo (in [place])   CAROTID DOPPLERS  08/11/2020   normal (cards, Dr. Elonda Husky)   CATARACT EXTRACTION  07/2013   COLONOSCOPY  12/2011   Recall 5 yrs   COLONOSCOPY  04/21/2017   Tubular adenoma x 1. Repeat in 5 years (Dr. [facility])   DEXA  02/2017; 03/2019   03/2019 T-score -2.0.  Repeat 2 yrs (Dr. Orvan Seen, [facility]).   Event monitor  08/26/2019   74 H->no adverse arrhythmias.  Occ PVC and PAT.   TRANSTHORACIC ECHOCARDIOGRAM  08/05/2019   EF 65-70%, grd I DD, mild MR.    Family History  Problem Relation Age of Onset   Breast cancer Mother        dx'd age 6   Heart attack Mother    Heart disease Mother    Stroke Father    Hypertension Father    Cancer Father    Hypertension Sister    Cancer Brother    Hypertension Brother    Heart attack Brother    Pancreatitis Brother    Hypertension Sister    Myasthenia gravis Sister  Social History   Socioeconomic History   Marital status: Married    Spouse name: Not on file   Number of children: Not on file   Years of education: Not on file   Highest education level: Not on file  Occupational History   Not on file  Tobacco Use   Smoking status: Never   Smokeless tobacco: Never  Vaping Use   Vaping Use: Never used  Substance and Sexual Activity   Alcohol use: Yes    Alcohol/week: 1.0 standard drink    Types: 1 Glasses of wine per week    Comment: 4-5 times weekly   Drug use: No   Sexual activity: Not on file  Other Topics Concern   Not on file  Social History Narrative   Married, 1 son, 1 grand-daughter.   Orig from [place], then [place] for 30 yrs, relocated back to [place].   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   NO tob.  Alc: 1 glass wine 4-5 times per week.   Social Determinants of Health   Financial Resource Strain: Not on file  Food  Insecurity: Not on file  Transportation Needs: Not on file  Physical Activity: Not on file  Stress: Not on file  Social Connections: Not on file  Intimate Partner Violence: Not on file    Outpatient Medications Prior to Visit  Medication Sig Dispense Refill   atorvastatin (LIPITOR) 40 MG tablet Take 1 tablet (40 mg total) by mouth daily. 90 tablet 3   LORazepam (ATIVAN) 0.5 MG tablet 1 tab po bid prn 60 tablet 1   meclizine (ANTIVERT) 25 MG tablet Take 1 tablet (25 mg total) by mouth 3 (three) times daily as needed for dizziness. 30 tablet 0   irbesartan (AVAPRO) 75 MG tablet TAKE 1/2 TABLET BY MOUTH TWICE A DAY 90 tablet 1   PARoxetine (PAXIL) 10 MG tablet TAKE 1 TABLET BY MOUTH EVERY DAY 30 tablet 0   No facility-administered medications prior to visit.    Allergies  Allergen Reactions   Citalopram Other (See Comments)    Heatwaves/hot flashes   Lisinopril Other (See Comments)    Jaw pain   Simvastatin Other (See Comments)    Orthostatic dizziness    ROS Review of Systems  Constitutional:  Negative for appetite change, chills, fatigue and fever.  HENT:  Negative for congestion, dental problem, ear pain and sore throat.   Eyes:  Negative for discharge, redness and visual disturbance.  Respiratory:  Negative for cough, chest tightness, shortness of breath and wheezing.   Cardiovascular:  Negative for chest pain, palpitations and leg swelling.  Gastrointestinal:  Negative for abdominal pain, blood in stool, diarrhea, nausea and vomiting.  Genitourinary:  Negative for difficulty urinating, dysuria, flank pain, frequency, hematuria and urgency.  Musculoskeletal:  Negative for arthralgias, back pain, joint swelling, myalgias and neck stiffness.  Skin:  Negative for pallor and rash.  Neurological:  Negative for dizziness, speech difficulty, weakness and headaches.  Hematological:  Negative for adenopathy. Does not bruise/bleed easily.  Psychiatric/Behavioral:  Negative for  confusion and sleep disturbance. The patient is not nervous/anxious.    PE; Vitals with BMI 10/11/2021 04/13/2021 09/29/2020  Height 5\' 2"  - 5\' 2"   Weight 146 lbs 144 lbs 8 oz 144 lbs 6 oz  BMI 32.3 - 55.7  Systolic 322 025 427  Diastolic 65 84 73  Pulse 54 60 54   Exam chaperoned by Deveron Furlong, CMA.  Gen: Alert, well appearing.  Patient is oriented to person, place, time, and situation. AFFECT: pleasant, lucid thought and speech. ENT: Ears: EACs clear, normal epithelium.  TMs with good light reflex and landmarks bilaterally.  Eyes: no injection, icteris, swelling, or exudate.  EOMI, PERRLA. Nose: no drainage or turbinate edema/swelling.  No injection or focal lesion.  Mouth: lips without lesion/swelling.  Oral mucosa pink and moist.  Dentition intact and without obvious caries or gingival swelling.  Oropharynx without erythema, exudate, or swelling.  Neck: supple/nontender.  No LAD, mass, or TM.  Carotid pulses 2+ bilaterally, without bruits. CV: RRR, no m/r/g.   LUNGS: CTA bilat, nonlabored resps, good aeration in all lung fields. ABD: soft, NT, ND, BS normal.  No hepatospenomegaly or mass.  No bruits. EXT: no clubbing, cyanosis, or edema.  Musculoskeletal: no joint swelling, erythema, warmth, or tenderness.  ROM of all joints intact. Skin - no sores or suspicious lesions or rashes or color changes  Pertinent labs:  Lab Results  Component Value Date   TSH 2.26 09/29/2020   Lab Results  Component Value Date   WBC 5.1 09/29/2020   HGB 14.2 09/29/2020   HCT 41.3 09/29/2020   MCV 89.0 09/29/2020   PLT 195 09/29/2020   Lab Results  Component Value Date   CREATININE 0.79 04/13/2021   BUN 11 04/13/2021   NA 139 04/13/2021   K 4.5 04/13/2021   CL 105 04/13/2021   CO2 25 04/13/2021   Lab Results  Component Value Date   ALT 20 06/28/2021   AST 20 06/28/2021   ALKPHOS 82 06/12/2020   BILITOT 1.0 04/13/2021   Lab Results  Component Value Date   CHOL 159 06/28/2021    Lab Results  Component Value Date   HDL 49.40 06/28/2021   Lab Results  Component Value Date   LDLCALC 87 06/28/2021   Lab Results  Component Value Date   TRIG 111.0 06/28/2021   Lab Results  Component Value Date   CHOLHDL 3 06/28/2021    ASSESSMENT AND PLAN:   #1 hypertension, well controlled.  Continue irbesartan half of the 75 mg tab twice a day. Electrolytes and creatinine today.  2.  Hyperlipidemia.  Doing well on atorvastatin 40 mg a day. Lipid panel and hepatic panel today.  3.  GAD.  Has been stable long-term on paroxetine 10 mg a day.  She takes lorazepam only rarely.  Controlled substance contract updated today.  4.  Health maintenance exam: Reviewed age and gender appropriate health maintenance issues (prudent diet, regular exercise, health risks of tobacco and excessive alcohol, use of seatbelts, fire alarms in home, use of sunscreen).  Also reviewed age and gender appropriate health screening as well as vaccine recommendations. Vaccines: all UTD. Labs: fasting HP labs Cervical ca screening: remote hx of hysterectomy for DUB, no further paps indicated. Breast ca screening: last mammogram [facility]/10/21, [facility]->[facility]. Colon ca screening: hx of adenomas, recall 04/2022. Osteoporosis screening: last DEXA 04/2019, osteopenia, [facility]->rpt 08/2021 stable--GYN plans rpt 2 yrs.  She is taking Ca++, 1000 U vit D daily.  An After Visit Summary was printed and given to the patient.  FOLLOW UP:  Return in about 6 months (around 04/11/2022) for routine chronic illness f/u.  Signed:  Crissie Sickles, MD           10/11/2021

## 2021-10-11 NOTE — Patient Instructions (Signed)

## 2021-10-12 LAB — CBC WITH DIFFERENTIAL/PLATELET
Absolute Monocytes: 510 cells/uL (ref 200–950)
Basophils Absolute: 32 cells/uL (ref 0–200)
Basophils Relative: 0.5 %
Eosinophils Absolute: 183 cells/uL (ref 15–500)
Eosinophils Relative: 2.9 %
HCT: 40.4 % (ref 35.0–45.0)
Hemoglobin: 13.6 g/dL (ref 11.7–15.5)
Lymphs Abs: 1701 cells/uL (ref 850–3900)
MCH: 30.2 pg (ref 27.0–33.0)
MCHC: 33.7 g/dL (ref 32.0–36.0)
MCV: 89.8 fL (ref 80.0–100.0)
MPV: 9.3 fL (ref 7.5–12.5)
Monocytes Relative: 8.1 %
Neutro Abs: 3875 cells/uL (ref 1500–7800)
Neutrophils Relative %: 61.5 %
Platelets: 177 10*3/uL (ref 140–400)
RBC: 4.5 10*6/uL (ref 3.80–5.10)
RDW: 12.4 % (ref 11.0–15.0)
Total Lymphocyte: 27 %
WBC: 6.3 10*3/uL (ref 3.8–10.8)

## 2021-10-12 LAB — COMPREHENSIVE METABOLIC PANEL
AG Ratio: 1.6 (calc) (ref 1.0–2.5)
ALT: 25 U/L (ref 6–29)
AST: 27 U/L (ref 10–35)
Albumin: 4.1 g/dL (ref 3.6–5.1)
Alkaline phosphatase (APISO): 70 U/L (ref 37–153)
BUN: 11 mg/dL (ref 7–25)
CO2: 24 mmol/L (ref 20–32)
Calcium: 9.3 mg/dL (ref 8.6–10.4)
Chloride: 104 mmol/L (ref 98–110)
Creat: 0.77 mg/dL (ref 0.60–1.00)
Globulin: 2.5 g/dL (calc) (ref 1.9–3.7)
Glucose, Bld: 81 mg/dL (ref 65–99)
Potassium: 4.4 mmol/L (ref 3.5–5.3)
Sodium: 139 mmol/L (ref 135–146)
Total Bilirubin: 1 mg/dL (ref 0.2–1.2)
Total Protein: 6.6 g/dL (ref 6.1–8.1)

## 2021-10-12 LAB — SPECIMEN COMPROMISED

## 2021-10-12 LAB — LIPID PANEL
Cholesterol: 149 mg/dL (ref <200)
HDL: 45 mg/dL — ABNORMAL LOW (ref >=50)
LDL Cholesterol (Calc): 82 mg/dL (calc)
Non-HDL Cholesterol (Calc): 104 mg/dL (calc) (ref <130)
Total CHOL/HDL Ratio: 3.3 (calc) (ref <5.0)
Triglycerides: 121 mg/dL (ref <150)

## 2021-10-12 LAB — TSH

## 2021-10-17 ENCOUNTER — Ambulatory Visit: Payer: Medicare HMO

## 2021-11-20 ENCOUNTER — Encounter: Payer: Self-pay | Admitting: Family Medicine

## 2021-11-22 ENCOUNTER — Ambulatory Visit (INDEPENDENT_AMBULATORY_CARE_PROVIDER_SITE_OTHER): Payer: Medicare HMO | Admitting: Family Medicine

## 2021-11-22 ENCOUNTER — Other Ambulatory Visit: Payer: Self-pay

## 2021-11-22 ENCOUNTER — Encounter: Payer: Self-pay | Admitting: Family Medicine

## 2021-11-22 VITALS — BP 140/80 | HR 54 | Temp 97.8°F | Ht 62.0 in | Wt 149.6 lb

## 2021-11-22 DIAGNOSIS — J019 Acute sinusitis, unspecified: Secondary | ICD-10-CM | POA: Diagnosis not present

## 2021-11-22 MED ORDER — AMOXICILLIN 875 MG PO TABS: 875.0000 mg | ORAL_TABLET | Freq: Two times a day (BID) | ORAL | 0 refills | Status: AC

## 2021-11-22 NOTE — Progress Notes (Signed)
OFFICE VISIT  11/22/2021  CC:  Chief Complaint  Patient presents with   Cough    Productive with yellowish green discharge; has used Mucinex and Flonase     Patient is a 76 y.o. female who presents for respiratory symptoms.  HPI: Symptoms started around 2 weeks ago and consistent of cough, sore throat, runny nose and congestion. This past Monday she felt better but symptoms returned Tuesday the next day. She has tried mucinex and normal saline to alleviate symptoms but nothing seems to work. ROS negative for fever, vomiting, diarrhea.   Past Medical History:  Diagnosis Date   Allergic rhinitis    Basal cell carcinoma    X 3: one on each leg and one on right shoulder   Carpal tunnel syndrome on both sides    11/17/19 NCS/EMG; mild.  Dr. Fredna Dow   Family history of colon cancer in father    Age 57.  Next colonoscopy due 04/2022.   Generalized anxiety disorder    History of adenomatous polyp of colon 2008; 2013; 04/2017   2018--Recall 5 yrs (Dr. Megan Salon w/[facility])   Hyperlipidemia, mixed    simva 2016; atorva 05/2019   Hypertension    Mitral valve prolapse    Osteoarthritis    Fingers and hips.   Osteopenia    DEXA 03/2019. T-score -2.0-->rpt 2 yrs (Dr. Julien Girt)   Palpitations    3d event monitor normal 08/26/19.  +Bradycardia and PVCs, followed by cardiology (BB d/c'd 01/2020).    Past Surgical History:  Procedure Laterality Date   ABDOMINAL HYSTERECTOMY  1979   Done for DUB.  Ovaries are still in.  No further paps necessary.   Truman TEST  2007   Normal stress echo (in [place])   CAROTID DOPPLERS  08/11/2020   normal (cards, Dr. Elonda Husky)   CATARACT EXTRACTION  07/2013   COLONOSCOPY  12/2011   Recall 5 yrs   COLONOSCOPY  04/21/2017   Tubular adenoma x 1. Repeat in 5 years (Dr. [facility])   DEXA  02/2017; 03/2019   03/2019 T-score -2.0.  Repeat 2 yrs (Dr. Orvan Seen, [facility]).   Event monitor  08/26/2019   74 H->no adverse arrhythmias.   Occ PVC and PAT.   TRANSTHORACIC ECHOCARDIOGRAM  08/05/2019   EF 65-70%, grd I DD, mild MR.    Outpatient Medications Prior to Visit  Medication Sig Dispense Refill   atorvastatin (LIPITOR) 40 MG tablet Take 1 tablet (40 mg total) by mouth daily. 90 tablet 3   irbesartan (AVAPRO) 75 MG tablet TAKE 1/2 TABLET BY MOUTH TWICE A DAY 90 tablet 3   LORazepam (ATIVAN) 0.5 MG tablet 1 tab po bid prn 60 tablet 1   meclizine (ANTIVERT) 25 MG tablet Take 1 tablet (25 mg total) by mouth 3 (three) times daily as needed for dizziness. 30 tablet 0   PARoxetine (PAXIL) 10 MG tablet Take 1 tablet (10 mg total) by mouth daily. 90 tablet 3   No facility-administered medications prior to visit.    Allergies  Allergen Reactions   Citalopram Other (See Comments)    Heatwaves/hot flashes   Lisinopril Other (See Comments)    Jaw pain   Simvastatin Other (See Comments)    Orthostatic dizziness    ROS As per HPI  PE: Vitals with BMI 11/22/2021 10/11/2021 04/13/2021  Height 5\' 2"  5\' 2"  -  Weight 149 lbs 10 oz 146 lbs 144 lbs 8 oz  BMI 27.36 26.7 -  Systolic 829 937 169  Diastolic 77 65 84  Pulse 54 54 60     Physical Exam Constitutional:      Appearance: Normal appearance.  HENT:     Nose: Congestion present.     Mouth/Throat:     Mouth: Mucous membranes are moist.     Pharynx: Oropharynx is clear.  Cardiovascular:     Rate and Rhythm: Normal rate and regular rhythm.  Pulmonary:     Effort: Pulmonary effort is normal.     Breath sounds: Normal breath sounds.  Lymphadenopathy:     Cervical: No cervical adenopathy.  Neurological:     Mental Status: She is alert.     LABS:  Last CBC Lab Results  Component Value Date   WBC 6.3 10/11/2021   HGB 13.6 10/11/2021   HCT 40.4 10/11/2021   MCV 89.8 10/11/2021   MCH 30.2 10/11/2021   RDW 12.4 10/11/2021   PLT 177 67/89/3810   Last metabolic panel Lab Results  Component Value Date   GLUCOSE 81 10/11/2021   NA 139 10/11/2021   K 4.4  10/11/2021   CL 104 10/11/2021   CO2 24 10/11/2021   BUN 11 10/11/2021   CREATININE 0.77 10/11/2021   CALCIUM 9.3 10/11/2021   PROT 6.6 10/11/2021   ALBUMIN 4.1 06/12/2020   BILITOT 1.0 10/11/2021   ALKPHOS 82 06/12/2020   AST 27 10/11/2021   ALT 25 10/11/2021   Last lipids Lab Results  Component Value Date   CHOL 149 10/11/2021   HDL 45 (L) 10/11/2021   LDLCALC 82 10/11/2021   TRIG 121 10/11/2021   CHOLHDL 3.3 10/11/2021   IMPRESSION AND PLAN:  Non-toxic appearing female who presents today for URI symptoms. Given she has had symptoms persist for two weeks, combined with a day of feeling better and then symptoms returning, it is suspected the patient has bacterial sinusitis. Will prescribe Amoxicillin 875 mg BID for 10 days.  An After Visit Summary was printed and given to the patient.  FOLLOW UP:  If symptoms persist  Phil Dopp - MS3   Signed:  Crissie Sickles, MD           11/22/2021

## 2021-11-22 NOTE — Progress Notes (Signed)
I personally was present during the history, physical exam, and medical decision-making activities of this service and have verified that the service and findings are accurately documented in the student's note. Signed:  Crissie Sickles, MD           11/22/2021

## 2022-01-14 ENCOUNTER — Telehealth: Payer: Self-pay

## 2022-01-14 NOTE — Telephone Encounter (Signed)
Called pt to schedule AWV. Please schedule with health coach, Toria or Sundae Maners. ? ?

## 2022-01-15 ENCOUNTER — Other Ambulatory Visit: Payer: Self-pay | Admitting: Family Medicine

## 2022-01-15 NOTE — Telephone Encounter (Signed)
Requesting: lorazepam ?Contract: 10/11/21 ?UDS: N/A  ?Last Visit: 11/22/21 ?Next Visit: 04/11/22 ?Last Refill: 04/13/21(60,1) ? ?Please Advise. Med pending ?

## 2022-01-16 NOTE — Telephone Encounter (Signed)
LM for pt regarding medication ?

## 2022-01-16 NOTE — Telephone Encounter (Signed)
Pt advised refill sent. °

## 2022-03-06 ENCOUNTER — Ambulatory Visit (INDEPENDENT_AMBULATORY_CARE_PROVIDER_SITE_OTHER): Payer: Medicare HMO

## 2022-03-06 DIAGNOSIS — Z0000 Encounter for general adult medical examination without abnormal findings: Secondary | ICD-10-CM | POA: Diagnosis not present

## 2022-03-06 NOTE — Patient Instructions (Signed)
Kathy Richardson , ?Thank you for taking time to come for your Medicare Wellness Visit. I appreciate your ongoing commitment to your health goals. Please review the following plan we discussed and let me know if I can assist you in the future.  ? ?Screening recommendations/referrals: ?Colonoscopy: Done 04/21/17 repeat every 5 years  ?Mammogram: Done 09/10/21 repeat every year  ?Bone Density: Done 09/10/21 repeat every 2 years  ?Recommended yearly ophthalmology/optometry visit for glaucoma screening and checkup ?Recommended yearly dental visit for hygiene and checkup ? ?Vaccinations: ?Influenza vaccine: Done 08/31/21 repeat every year  ?Pneumococcal vaccine: Up to date ?Tdap vaccine: Done 11/30/14 repeat every 10 years ?Shingles vaccine: Completed 07/02/18 & 11/12/18   ?Covid-19:Completed 1/25, 2/22, 08/01/20 & 05/05/21 ? ?Advanced directives: Please bring a copy of your health care power of attorney and living will to the office at your convenience. ? ? ?Conditions/risks identified: lose 10 lbs  ? ?Next appointment: Follow up in one year for your annual wellness visit  ? ? ?Preventive Care 74 Years and Older, Female ?Preventive care refers to lifestyle choices and visits with your health care provider that can promote health and wellness. ?What does preventive care include? ?A yearly physical exam. This is also called an annual well check. ?Dental exams once or twice a year. ?Routine eye exams. Ask your health care provider how often you should have your eyes checked. ?Personal lifestyle choices, including: ?Daily care of your teeth and gums. ?Regular physical activity. ?Eating a healthy diet. ?Avoiding tobacco and drug use. ?Limiting alcohol use. ?Practicing safe sex. ?Taking low-dose aspirin every day. ?Taking vitamin and mineral supplements as recommended by your health care provider. ?What happens during an annual well check? ?The services and screenings done by your health care provider during your annual well check will  depend on your age, overall health, lifestyle risk factors, and family history of disease. ?Counseling  ?Your health care provider may ask you questions about your: ?Alcohol use. ?Tobacco use. ?Drug use. ?Emotional well-being. ?Home and relationship well-being. ?Sexual activity. ?Eating habits. ?History of falls. ?Memory and ability to understand (cognition). ?Work and work Statistician. ?Reproductive health. ?Screening  ?You may have the following tests or measurements: ?Height, weight, and BMI. ?Blood pressure. ?Lipid and cholesterol levels. These may be checked every 5 years, or more frequently if you are over 39 years old. ?Skin check. ?Lung cancer screening. You may have this screening every year starting at age 6 if you have a 30-pack-year history of smoking and currently smoke or have quit within the past 15 years. ?Fecal occult blood test (FOBT) of the stool. You may have this test every year starting at age 64. ?Flexible sigmoidoscopy or colonoscopy. You may have a sigmoidoscopy every 5 years or a colonoscopy every 10 years starting at age 58. ?Hepatitis C blood test. ?Hepatitis B blood test. ?Sexually transmitted disease (STD) testing. ?Diabetes screening. This is done by checking your blood sugar (glucose) after you have not eaten for a while (fasting). You may have this done every 1-3 years. ?Bone density scan. This is done to screen for osteoporosis. You may have this done starting at age 43. ?Mammogram. This may be done every 1-2 years. Talk to your health care provider about how often you should have regular mammograms. ?Talk with your health care provider about your test results, treatment options, and if necessary, the need for more tests. ?Vaccines  ?Your health care provider may recommend certain vaccines, such as: ?Influenza vaccine. This is recommended every year. ?Tetanus,  diphtheria, and acellular pertussis (Tdap, Td) vaccine. You may need a Td booster every 10 years. ?Zoster vaccine. You may  need this after age 40. ?Pneumococcal 13-valent conjugate (PCV13) vaccine. One dose is recommended after age 15. ?Pneumococcal polysaccharide (PPSV23) vaccine. One dose is recommended after age 30. ?Talk to your health care provider about which screenings and vaccines you need and how often you need them. ?This information is not intended to replace advice given to you by your health care provider. Make sure you discuss any questions you have with your health care provider. ?Document Released: 11/24/2015 Document Revised: 07/17/2016 Document Reviewed: 08/29/2015 ?Elsevier Interactive Patient Education ? 2017 [employer]. ? ?Fall Prevention in the Home ?Falls can cause injuries. They can happen to people of all ages. There are many things you can do to make your home safe and to help prevent falls. ?What can I do on the outside of my home? ?Regularly fix the edges of walkways and driveways and fix any cracks. ?Remove anything that might make you trip as you walk through a door, such as a raised step or threshold. ?Trim any bushes or trees on the path to your home. ?Use bright outdoor lighting. ?Clear any walking paths of anything that might make someone trip, such as rocks or tools. ?Regularly check to see if handrails are loose or broken. Make sure that both sides of any steps have handrails. ?Any raised decks and porches should have guardrails on the edges. ?Have any leaves, snow, or ice cleared regularly. ?Use sand or salt on walking paths during winter. ?Clean up any spills in your garage right away. This includes oil or grease spills. ?What can I do in the bathroom? ?Use night lights. ?Install grab bars by the toilet and in the tub and shower. Do not use towel bars as grab bars. ?Use non-skid mats or decals in the tub or shower. ?If you need to sit down in the shower, use a plastic, non-slip stool. ?Keep the floor dry. Clean up any water that spills on the floor as soon as it happens. ?Remove soap buildup in  the tub or shower regularly. ?Attach bath mats securely with double-sided non-slip rug tape. ?Do not have throw rugs and other things on the floor that can make you trip. ?What can I do in the bedroom? ?Use night lights. ?Make sure that you have a light by your bed that is easy to reach. ?Do not use any sheets or blankets that are too big for your bed. They should not hang down onto the floor. ?Have a firm chair that has side arms. You can use this for support while you get dressed. ?Do not have throw rugs and other things on the floor that can make you trip. ?What can I do in the kitchen? ?Clean up any spills right away. ?Avoid walking on wet floors. ?Keep items that you use a lot in easy-to-reach places. ?If you need to reach something above you, use a strong step stool that has a grab bar. ?Keep electrical cords out of the way. ?Do not use floor polish or wax that makes floors slippery. If you must use wax, use non-skid floor wax. ?Do not have throw rugs and other things on the floor that can make you trip. ?What can I do with my stairs? ?Do not leave any items on the stairs. ?Make sure that there are handrails on both sides of the stairs and use them. Fix handrails that are broken or loose. Make  sure that handrails are as long as the stairways. ?Check any carpeting to make sure that it is firmly attached to the stairs. Fix any carpet that is loose or worn. ?Avoid having throw rugs at the top or bottom of the stairs. If you do have throw rugs, attach them to the floor with carpet tape. ?Make sure that you have a light switch at the top of the stairs and the bottom of the stairs. If you do not have them, ask someone to add them for you. ?What else can I do to help prevent falls? ?Wear shoes that: ?Do not have high heels. ?Have rubber bottoms. ?Are comfortable and fit you well. ?Are closed at the toe. Do not wear sandals. ?If you use a stepladder: ?Make sure that it is fully opened. Do not climb a closed  stepladder. ?Make sure that both sides of the stepladder are locked into place. ?Ask someone to hold it for you, if possible. ?Clearly mark and make sure that you can see: ?Any grab bars or handrails. ?First and l

## 2022-03-06 NOTE — Progress Notes (Addendum)
Virtual Visit via Telephone Note ? ?I connected with  Kathy Richardson on 03/06/22 at  9:30 AM EDT by telephone and verified that I am speaking with the correct person using two identifiers. ? ?Medicare Annual Wellness visit completed telephonically due to Covid-19 pandemic.  ? ?Persons participating in this call: This Health Coach and this patient.  ? ?Location: ?Patient: home ?Provider: office ?  ?I discussed the limitations, risks, security and privacy concerns of performing an evaluation and management service by telephone and the availability of in person appointments. The patient expressed understanding and agreed to proceed. ? ?Unable to perform video visit due to video visit attempted and failed and/or patient does not have video capability.  ? ?Some vital signs may be absent or patient reported.  ? ?Willette Brace, LPN ? ? ?Subjective:  ? Kathy Richardson is a 76 y.o. female who presents for Medicare Annual (Subsequent) preventive examination. ? ?Review of Systems    ? ?Cardiac Risk Factors include: advanced age (>52mn, >>28women) ? ?   ?Objective:  ?  ?There were no vitals filed for this visit. ?There is no height or weight on file to calculate BMI. ? ? ?  03/06/2022  ?  9:30 AM 09/13/2020  ?  9:06 AM 02/17/2018  ? 10:13 AM 02/03/2017  ?  8:49 AM 06/11/2015  ?  3:45 PM  ?Advanced Directives  ?Does Patient Have a Medical Advance Directive? Yes Yes Yes Yes No  ?Type of AParamedicof [place] will [place] will [place] will   ?Copy of [place] Chart? No - copy requested No - copy requested No - copy requested No - copy requested   ? ? ?Current Medications (verified) ?Outpatient Encounter Medications as of 03/06/2022  ?Medication Sig  ? atorvastatin (LIPITOR) 40 MG tablet Take 1 tablet (40 mg total) by mouth daily.  ? irbesartan (AVAPRO) 75 MG tablet TAKE 1/2 TABLET BY MOUTH TWICE A  DAY  ? LORazepam (ATIVAN) 0.5 MG tablet TAKE 1 TABLET BY MOUTH TWICE A DAY AS NEEDED  ? meclizine (ANTIVERT) 25 MG tablet Take 1 tablet (25 mg total) by mouth 3 (three) times daily as needed for dizziness.  ? PARoxetine (PAXIL) 10 MG tablet Take 1 tablet (10 mg total) by mouth daily.  ? ?No facility-administered encounter medications on file as of 03/06/2022.  ? ? ?Allergies (verified) ?Citalopram, Lisinopril, and Simvastatin  ? ?History: ?Past Medical History:  ?Diagnosis Date  ? Allergic rhinitis   ? Basal cell carcinoma   ? X 3: one on each leg and one on right shoulder  ? Carpal tunnel syndrome on both sides   ? 11/17/19 NCS/EMG; mild.  Dr. KFredna Dow ? Family history of colon cancer in father   ? Age 76  Next colonoscopy due 04/2022.  ? Generalized anxiety disorder   ? History of adenomatous polyp of colon 2008; 2013; 04/2017  ? 2018--Recall 5 yrs (Dr. AMegan Salonw/[facility])  ? Hyperlipidemia, mixed   ? simva 2016; atorva 05/2019  ? Hypertension   ? Mitral valve prolapse   ? Osteoarthritis   ? Fingers and hips.  ? Osteopenia   ? DEXA 03/2019. T-score -2.0-->rpt 2 yrs (Dr. AJulien Girt  ? Palpitations   ? 3d event monitor normal 08/26/19.  +Bradycardia and PVCs, followed by cardiology (BB d/c'd 01/2020).  ? ?Past Surgical History:  ?Procedure Laterality Date  ? ABDOMINAL HYSTERECTOMY  1979  ?  Done for DUB.  Ovaries are still in.  No further paps necessary.  ? APPENDECTOMY  1979  ? CARDIOVASCULAR STRESS TEST  2007  ? Normal stress echo (in [place])  ? CAROTID DOPPLERS  08/11/2020  ? normal (cards, Dr. Elonda Husky)  ? CATARACT EXTRACTION  07/2013  ? COLONOSCOPY  12/2011  ? Recall 5 yrs  ? COLONOSCOPY  04/21/2017  ? Tubular adenoma x 1. Repeat in 5 years (Dr. [facility])  ? DEXA  02/2017; 03/2019  ? 03/2019 T-score -2.0.  Repeat 2 yrs (Dr. [facility], [facility]).  ? Event monitor  08/26/2019  ? 74 H->no adverse arrhythmias.  Occ PVC and PAT.  ? TRANSTHORACIC ECHOCARDIOGRAM  08/05/2019  ? EF 65-70%, grd I DD, mild MR.  ? ?Family History   ?Problem Relation Age of Onset  ? Breast cancer Mother   ?     dx'd age 38  ? Heart attack Mother   ? Heart disease Mother   ? Stroke Father   ? Hypertension Father   ? Cancer Father   ? Hypertension Sister   ? Cancer Brother   ? Hypertension Brother   ? Heart attack Brother   ? Pancreatitis Brother   ? Hypertension Sister   ? Myasthenia gravis Sister   ? ?Social History  ? ?Socioeconomic History  ? Marital status: Married  ?  Spouse name: Not on file  ? Number of children: Not on file  ? Years of education: Not on file  ? Highest education level: 12th grade  ?Occupational History  ? Not on file  ?Tobacco Use  ? Smoking status: Never  ? Smokeless tobacco: Never  ?Vaping Use  ? Vaping Use: Never used  ?Substance and Sexual Activity  ? Alcohol use: Yes  ?  Alcohol/week: 1.0 standard drink  ?  Types: 1 Glasses of wine per week  ?  Comment: 4-5 times weekly  ? Drug use: No  ? Sexual activity: Not on file  ?Other Topics Concern  ? Not on file  ?Social History Narrative  ? Married, 1 son, 1 grand-daughter.  ? Orig from [place], then [place] for 30 yrs, relocated back to [place].  ? Education: HS  ? 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  ? NO tob.  Alc: 1 glass wine 4-5 times per week.  ? ?Social Determinants of Health  ? ?Financial Resource Strain: Low Risk   ? Difficulty of Paying Living Expenses: Not hard at all  ?Food Insecurity: No Food Insecurity  ? Worried About Charity fundraiser in the Last Year: Never true  ? Ran Out of Food in the Last Year: Never true  ?Transportation Needs: No Transportation Needs  ? Lack of Transportation (Medical): No  ? Lack of Transportation (Non-Medical): No  ?Physical Activity: Sufficiently Active  ? Days of Exercise per Week: 4 days  ? Minutes of Exercise per Session: 40 min  ?Stress: No Stress Concern Present  ? Feeling of Stress : Not at all  ?Social Connections: Moderately Integrated  ? Frequency of Communication with Friends and Family: More than three  times a week  ? Frequency of Social Gatherings with Friends and Family: More than three times a week  ? Attends Religious Services: More than 4 times per year  ? Active Member of Clubs or Organizations: No  ? Attends Archivist Meetings: Never  ? Marital Status: Married  ? ? ?Tobacco Counseling ?Counseling given: Not Answered ? ? ?  Clinical Intake: ? ?Pre-visit preparation completed: Yes ? ?Pain : No/denies pain ? ?  ? ?BMI - recorded: 27.36 ?Nutritional Status: BMI 25 -29 Overweight ?Nutritional Risks: None ?Diabetes: No ? ?How often do you need to have someone help you when you read instructions, pamphlets, or other written materials from your doctor or pharmacy?: 1 - Never ? ?Diabetic?no ? ?Interpreter Needed?: No ? ?Information entered by :: Charlott Rakes, LPN ? ? ?Activities of Daily Living ? ?  03/06/2022  ?  9:31 AM  ?In your present state of health, do you have any difficulty performing the following activities:  ?Hearing? 0  ?Vision? 0  ?Difficulty concentrating or making decisions? 0  ?Walking or climbing stairs? 0  ?Dressing or bathing? 0  ?Doing errands, shopping? 0  ?Preparing Food and eating ? N  ?Using the Toilet? N  ?In the past six months, have you accidently leaked urine? N  ?Do you have problems with loss of bowel control? N  ?Managing your Medications? N  ?Managing your Finances? N  ?Housekeeping or managing your Housekeeping? N  ? ? ?Patient Care Team: ?McGowen, Adrian Blackwater, MD as PCP - General (Family Medicine) ?Jola Schmidt, MD as Consulting Physician (Ophthalmology) ?[name], MD as Consulting Physician (Obstetrics and Gynecology) ?Ronnette Juniper, MD as Consulting Physician (Gastroenterology) ?Ulla Gallo, MD as Consulting Physician (Dermatology) ?Loni Beckwith Neita Goodnight., MD as Consulting Physician (Cardiology) ? ?Indicate any recent Medical Services you may have received from other than [facility] providers in the past year (date may be approximate). ? ?   ?Assessment:  ?  This is a routine wellness examination for Kathy Richardson. ? ?Hearing/Vision screen ?Hearing Screening - Comments:: Pt denies any hearing issues  ?Vision Screening - Comments:: Pt follows up with Dr Gala Romney with [facility]

## 2022-04-11 ENCOUNTER — Ambulatory Visit (INDEPENDENT_AMBULATORY_CARE_PROVIDER_SITE_OTHER): Payer: Medicare HMO | Admitting: Family Medicine

## 2022-04-11 ENCOUNTER — Encounter: Payer: Self-pay | Admitting: Family Medicine

## 2022-04-11 VITALS — BP 130/72 | HR 57 | Temp 98.2°F | Ht 62.0 in | Wt 147.8 lb

## 2022-04-11 DIAGNOSIS — I10 Essential (primary) hypertension: Secondary | ICD-10-CM | POA: Diagnosis not present

## 2022-04-11 DIAGNOSIS — R42 Dizziness and giddiness: Secondary | ICD-10-CM | POA: Diagnosis not present

## 2022-04-11 DIAGNOSIS — F411 Generalized anxiety disorder: Secondary | ICD-10-CM | POA: Diagnosis not present

## 2022-04-11 DIAGNOSIS — E7800 Pure hypercholesterolemia, unspecified: Secondary | ICD-10-CM | POA: Diagnosis not present

## 2022-04-11 DIAGNOSIS — Z79899 Other long term (current) drug therapy: Secondary | ICD-10-CM

## 2022-04-11 DIAGNOSIS — R69 Illness, unspecified: Secondary | ICD-10-CM | POA: Diagnosis not present

## 2022-04-11 MED ORDER — IRBESARTAN 75 MG PO TABS: ORAL_TABLET | ORAL | 3 refills | Status: DC

## 2022-04-11 MED ORDER — ATORVASTATIN CALCIUM 40 MG PO TABS: 40.0000 mg | ORAL_TABLET | Freq: Every day | ORAL | 3 refills | Status: DC

## 2022-04-11 NOTE — Progress Notes (Signed)
OFFICE VISIT  04/11/2022  CC:  Chief Complaint  Patient presents with   Hypertension   Hyperlipidemia    Pt is fasting    Patient is a 76 y.o. female who presents for 6 mo f/u hypertension, hyperlipidemia, and GAD. A/P as of last visit: "#1 hypertension, well controlled.  Continue irbesartan half of the 75 mg tab twice a day. Electrolytes and creatinine today.  2.  Hyperlipidemia.  Doing well on atorvastatin 40 mg a day. Lipid panel and hepatic panel today.  3.  GAD.  Has been stable long-term on paroxetine 10 mg a day.  She takes lorazepam only rarely.  Controlled substance contract updated today.   4.  Health maintenance exam: Reviewed age and gender appropriate health maintenance issues (prudent diet, regular exercise, health risks of tobacco and excessive alcohol, use of seatbelts, fire alarms in home, use of sunscreen).  Also reviewed age and gender appropriate health screening as well as vaccine recommendations. Vaccines: all UTD. Labs: fasting HP labs Cervical ca screening: remote hx of hysterectomy for DUB, no further paps indicated. Breast ca screening: last [facility]/21, [facility]. Colon ca screening: hx of adenomas, recall 04/2022. Osteoporosis screening: last DEXA 04/2019, osteopenia, [facility]->rpt 08/2021 stable--GYN plans rpt 2 yrs.  She is taking Ca++, 1000 U vit D daily."  INTERIM HX: She is feeling very well. Mood is good, anxiety level minimal.  Continues Paxil 10 mg a day, lorazepam twice daily as needed.  Home blood pressures consistently less than 130/80.  She has had a problem with some intermittent lightheadedness during the day that seems and triggered.  Lately she has started checking her blood pressure during these times and is noted low diastolic, around 50. Yesterday she took her irbesartan half tab in the morning but did not take her evening dose.  Blood pressure is normal today and she has felt much better.   PMP  [facility] reviewed today: most recent rx for lorazepam was filled 01/16/22, # [id number], rx by me. No red flags.  ROS as above, plus--> no fevers, no CP, no SOB, no wheezing, no cough, no HAs, no rashes, no melena/hematochezia.  No polyuria or polydipsia.  No myalgias or arthralgias.  No focal weakness, paresthesias, or tremors.  No acute vision or hearing abnormalities.  No dysuria or unusual/new urinary urgency or frequency.  No recent changes in lower legs. No n/v/d or abd pain.  No palpitations.    Past Medical History:  Diagnosis Date   Allergic rhinitis    Basal cell carcinoma    X 3: one on each leg and one on right shoulder   Carpal tunnel syndrome on both sides    11/17/19 NCS/EMG; mild.  Dr. Fredna Dow   Family history of colon cancer in father    Age 19.  Next colonoscopy due 04/2022.   Generalized anxiety disorder    History of adenomatous polyp of colon 2008; 2013; 04/2017   2018--Recall 5 yrs (Dr. Megan Salon w/[facility])   Hyperlipidemia, mixed    simva 2016; atorva 05/2019   Hypertension    Mitral valve prolapse    Osteoarthritis    Fingers and hips.   Osteopenia    DEXA 03/2019. T-score -2.0-->rpt 2 yrs (Dr. Julien Girt)   Palpitations    3d event monitor normal 08/26/19.  +Bradycardia and PVCs, followed by cardiology (BB d/c'd 01/2020).    Past Surgical History:  Procedure Laterality Date   ABDOMINAL HYSTERECTOMY  1979   Done for DUB.  Ovaries are still in.  No further paps necessary.   [place] TEST  2007   Normal stress echo (in [place])   CAROTID DOPPLERS  08/11/2020   normal (cards, Dr. Elonda Husky)   CATARACT EXTRACTION  07/2013   COLONOSCOPY  12/2011   Recall 5 yrs   COLONOSCOPY  04/21/2017   Tubular adenoma x 1. Repeat in 5 years (Dr. [facility])   DEXA  02/2017; 03/2019   03/2019 T-score -2.0.  Repeat 2 yrs (Dr. Or[facility] Se[facility], [facility]).   Event monitor  08/26/2019   74 H->no adverse arrhythmias.  Occ PVC and PAT.   TRANSTHORACIC ECHOCARDIOGRAM   08/05/2019   EF 65-70%, grd I DD, mild MR.    Outpatient Medications Prior to Visit  Medication Sig Dispense Refill   LORazepam (ATIVAN) 0.5 MG tablet TAKE 1 TABLET BY MOUTH TWICE A DAY AS NEEDED 60 tablet 3   PARoxetine (PAXIL) 10 MG tablet Take 1 tablet (10 mg total) by mouth daily. 90 tablet 3   atorvastatin (LIPITOR) 40 MG tablet Take 1 tablet (40 mg total) by mouth daily. 90 tablet 3   irbesartan (AVAPRO) 75 MG tablet TAKE 1/2 TABLET BY MOUTH TWICE A DAY 90 tablet 3   meclizine (ANTIVERT) 25 MG tablet Take 1 tablet (25 mg total) by mouth 3 (three) times daily as needed for dizziness. (Patient not taking: Reported on 04/11/2022) 30 tablet 0   No facility-administered medications prior to visit.    Allergies  Allergen Reactions   Citalopram Other (See Comments)    Heatwaves/hot flashes   Lisinopril Other (See Comments)    Jaw pain   Simvastatin Other (See Comments)    Orthostatic dizziness    ROS As per HPI  PE:    04/11/2022    9:47 AM 11/22/2021    2:20 PM 11/22/2021    1:48 PM  Vitals with BMI  Height '5\' 2"'$   '5\' 2"'$   Weight 147 lbs 13 oz  149 lbs 10 oz  BMI 36.64  40.34  Systolic 742 595 638  Diastolic 72 80 77  Pulse 57  54     Physical Exam  Gen: Alert, well appearing.  Patient is oriented to person, place, time, and situation. AFFECT: pleasant, lucid thought and speech. CV: RRR, no m/r/g.   LUNGS: CTA bilat, nonlabored resps, good aeration in all lung fields. EXT: no clubbing or cyanosis.  no edema.    LABS:  Last CBC Lab Results  Component Value Date   WBC 6.3 10/11/2021   HGB 13.6 10/11/2021   HCT 40.4 10/11/2021   MCV 89.8 10/11/2021   MCH 30.2 10/11/2021   RDW 12.4 10/11/2021   PLT 177 75/64/3329   Last metabolic panel Lab Results  Component Value Date   GLUCOSE 81 10/11/2021   NA 139 10/11/2021   K 4.4 10/11/2021   CL 104 10/11/2021   CO2 24 10/11/2021   BUN 11 10/11/2021   CREATININE 0.77 10/11/2021   CALCIUM 9.3 10/11/2021   PROT 6.6  10/11/2021   ALBUMIN 4.1 06/12/2020   BILITOT 1.0 10/11/2021   ALKPHOS 82 06/12/2020   AST 27 10/11/2021   ALT 25 10/11/2021   Last lipids Lab Results  Component Value Date   CHOL 149 10/11/2021   HDL 45 (L) 10/11/2021   LDLCALC 82 10/11/2021   TRIG 121 10/11/2021   CHOLHDL 3.3 10/11/2021   IMPRESSION AND PLAN:  #1 hypertension. It is possible her blood pressure  was getting a little bit low, causing dizziness. She seems to feel better on taking just a half a 75 mg tab once a day rather than twice a day. Electrolytes and creatinine today.  2.  Hyperlipidemia, stable on atorvastatin 40 mg a day. Lipid panel and hepatic panel today  #3 GAD.  Doing well long on Paxil 10 mg a day long-term. Lorazepam 0.5 mg twice daily as needed.  No new prescription was needed today. Controlled substance contract is up-to-date.  We will do urine drug screen at next follow-up in 6 months.  An After Visit Summary was printed and given to the patient.  FOLLOW UP: Return in about 6 months (around 10/11/2022) for annual CPE (fasting. Cpe 10/2022  Signed:  Crissie Sickles, MD           04/11/2022

## 2022-04-12 LAB — COMPREHENSIVE METABOLIC PANEL
AG Ratio: 2 (calc) (ref 1.0–2.5)
ALT: 20 U/L (ref 6–29)
AST: 19 U/L (ref 10–35)
Albumin: 4.2 g/dL (ref 3.6–5.1)
Alkaline phosphatase (APISO): 70 U/L (ref 37–153)
BUN: 13 mg/dL (ref 7–25)
CO2: 23 mmol/L (ref 20–32)
Calcium: 9.5 mg/dL (ref 8.6–10.4)
Chloride: 105 mmol/L (ref 98–110)
Creat: 0.76 mg/dL (ref 0.60–1.00)
Globulin: 2.1 g/dL (calc) (ref 1.9–3.7)
Glucose, Bld: 104 mg/dL — ABNORMAL HIGH (ref 65–99)
Potassium: 4.2 mmol/L (ref 3.5–5.3)
Sodium: 140 mmol/L (ref 135–146)
Total Bilirubin: 1 mg/dL (ref 0.2–1.2)
Total Protein: 6.3 g/dL (ref 6.1–8.1)

## 2022-04-12 LAB — LIPID PANEL
Cholesterol: 169 mg/dL (ref <200)
HDL: 41 mg/dL — ABNORMAL LOW (ref >=50)
LDL Cholesterol (Calc): 103 mg/dL (calc) — ABNORMAL HIGH
Non-HDL Cholesterol (Calc): 128 mg/dL (calc) (ref <130)
Total CHOL/HDL Ratio: 4.1 (calc) (ref <5.0)
Triglycerides: 146 mg/dL (ref <150)

## 2022-06-10 DIAGNOSIS — D123 Benign neoplasm of transverse colon: Secondary | ICD-10-CM | POA: Diagnosis not present

## 2022-06-10 DIAGNOSIS — Z86010 Personal history of colonic polyps: Secondary | ICD-10-CM | POA: Diagnosis not present

## 2022-06-10 DIAGNOSIS — D128 Benign neoplasm of rectum: Secondary | ICD-10-CM | POA: Diagnosis not present

## 2022-06-10 DIAGNOSIS — K5730 Diverticulosis of large intestine without perforation or abscess without bleeding: Secondary | ICD-10-CM | POA: Diagnosis not present

## 2022-06-10 DIAGNOSIS — K648 Other hemorrhoids: Secondary | ICD-10-CM | POA: Diagnosis not present

## 2022-06-10 DIAGNOSIS — Z09 Encounter for follow-up examination after completed treatment for conditions other than malignant neoplasm: Secondary | ICD-10-CM | POA: Diagnosis not present

## 2022-06-10 DIAGNOSIS — K644 Residual hemorrhoidal skin tags: Secondary | ICD-10-CM | POA: Diagnosis not present

## 2022-06-10 HISTORY — PX: COLONOSCOPY: SHX174

## 2022-06-12 DIAGNOSIS — D123 Benign neoplasm of transverse colon: Secondary | ICD-10-CM | POA: Diagnosis not present

## 2022-07-02 ENCOUNTER — Encounter: Payer: Self-pay | Admitting: Family Medicine

## 2022-07-09 ENCOUNTER — Telehealth: Payer: Self-pay | Admitting: Family Medicine

## 2022-07-09 NOTE — Telephone Encounter (Signed)
Pharmacy called to confirm that Paxil is still an active prescription on patient chart. Confirmation provided.

## 2022-08-26 DIAGNOSIS — I493 Ventricular premature depolarization: Secondary | ICD-10-CM | POA: Diagnosis not present

## 2022-08-26 DIAGNOSIS — E785 Hyperlipidemia, unspecified: Secondary | ICD-10-CM | POA: Diagnosis not present

## 2022-08-26 DIAGNOSIS — R0600 Dyspnea, unspecified: Secondary | ICD-10-CM | POA: Diagnosis not present

## 2022-08-26 DIAGNOSIS — I10 Essential (primary) hypertension: Secondary | ICD-10-CM | POA: Diagnosis not present

## 2022-09-11 DIAGNOSIS — H26493 Other secondary cataract, bilateral: Secondary | ICD-10-CM | POA: Diagnosis not present

## 2022-09-11 DIAGNOSIS — Z961 Presence of intraocular lens: Secondary | ICD-10-CM | POA: Diagnosis not present

## 2022-09-11 DIAGNOSIS — Z83518 Family history of other specified eye disorder: Secondary | ICD-10-CM | POA: Diagnosis not present

## 2022-09-11 DIAGNOSIS — H35373 Puckering of macula, bilateral: Secondary | ICD-10-CM | POA: Diagnosis not present

## 2022-10-03 ENCOUNTER — Other Ambulatory Visit: Payer: Self-pay | Admitting: Family Medicine

## 2022-10-16 ENCOUNTER — Encounter: Payer: Medicare HMO | Admitting: Family Medicine

## 2022-10-20 ENCOUNTER — Other Ambulatory Visit: Payer: Self-pay | Admitting: Family Medicine

## 2022-10-21 NOTE — Telephone Encounter (Signed)
Requesting: lorazepam Contract: 10/11/21 UDS: n/a  Last Visit: 04/11/22 Next Visit: 10/29/22 Last Refill: 01/16/22(60,3)  Please Advise. Med pending

## 2022-10-29 ENCOUNTER — Ambulatory Visit (INDEPENDENT_AMBULATORY_CARE_PROVIDER_SITE_OTHER): Payer: Medicare HMO | Admitting: Family Medicine

## 2022-10-29 ENCOUNTER — Encounter: Payer: Self-pay | Admitting: Family Medicine

## 2022-10-29 VITALS — BP 123/76 | HR 57 | Temp 98.6°F | Ht 62.01 in | Wt 133.8 lb

## 2022-10-29 DIAGNOSIS — E7800 Pure hypercholesterolemia, unspecified: Secondary | ICD-10-CM

## 2022-10-29 DIAGNOSIS — Z79899 Other long term (current) drug therapy: Secondary | ICD-10-CM

## 2022-10-29 DIAGNOSIS — F411 Generalized anxiety disorder: Secondary | ICD-10-CM | POA: Diagnosis not present

## 2022-10-29 DIAGNOSIS — R69 Illness, unspecified: Secondary | ICD-10-CM | POA: Diagnosis not present

## 2022-10-29 DIAGNOSIS — I10 Essential (primary) hypertension: Secondary | ICD-10-CM

## 2022-10-29 DIAGNOSIS — Z0000 Encounter for general adult medical examination without abnormal findings: Secondary | ICD-10-CM

## 2022-10-29 MED ORDER — PAROXETINE HCL 10 MG PO TABS: 10.0000 mg | ORAL_TABLET | Freq: Every day | ORAL | 3 refills | Status: DC

## 2022-10-29 NOTE — Progress Notes (Signed)
Office Note 10/29/2022  CC:  Chief Complaint  Patient presents with   Annual Exam    Pt is fasting    HPI:  Patient is a 76 y.o. female who is here for annual health maintenance exam and follow-up hypertension, hyperlipidemia, and GAD  She feels well.  She has purposefully lost 14 pounds over the last 6 months doing a weight watchers diet.  Home blood pressures consistently 120s over 70s.  Taking Paxil daily and uses lorazepam on occasion when excessively anxious or cannot sleep.  PMP AWARE reviewed today: most recent rx for lorazepam was filled 10/21/2022, # 59, rx by me. No red flags.   Past Medical History:  Diagnosis Date   Allergic rhinitis    Basal cell carcinoma    X 3: one on each leg and one on right shoulder   Carpal tunnel syndrome on both sides    11/17/19 NCS/EMG; mild.  Dr. Fredna Dow   Diverticulosis    Noted on colonoscopies   Family history of colon cancer in father    Age 36.  Next colonoscopy due 04/2022.   Generalized anxiety disorder    History of adenomatous polyp of colon 2008; 2013; 04/2017   Most recent 05/2022--Recall 5 yrs (Dr. Megan Salon w/[facility])   Hyperlipidemia, mixed    simva 2016; atorva 05/2019   Hypertension    Mitral valve prolapse    Osteoarthritis    Fingers and hips.   Osteopenia    DEXA 03/2019. T-score -2.0-->rpt 2 yrs (Dr. Julien Girt)   Palpitations    3d event monitor normal 08/26/19.  +Bradycardia and PVCs, followed by cardiology (BB d/c'd 01/2020).   Raynaud phenomenon     Past Surgical History:  Procedure Laterality Date   ABDOMINAL HYSTERECTOMY  1979   Done for DUB.  Ovaries are still in.  No further paps necessary.   [place] TEST  2007   Normal stress echo (in [place])   CAROTID DOPPLERS  08/11/2020   normal (cards, Dr. Elonda Husky)   CATARACT EXTRACTION  07/2013   COLONOSCOPY  12/2011   Recall 5 yrs   COLONOSCOPY  04/21/2017   Tubular adenoma x 1. Repeat in 5 years (Dr. [facility])    COLONOSCOPY  06/10/2022   adenoma x1. Recall 5 yrs   DEXA  02/2017; 03/2019   03/2019 T-score -2.0.  Repeat 2 yrs (Dr. Orvan Seen, [facility]).   Event monitor  08/26/2019   74 H->no adverse arrhythmias.  Occ PVC and PAT.   TRANSTHORACIC ECHOCARDIOGRAM  08/05/2019   EF 65-70%, grd I DD, mild MR.    Family History  Problem Relation Age of Onset   Breast cancer Mother        dx'd age 18   Heart attack Mother    Heart disease Mother    Stroke Father    Hypertension Father    Cancer Father    Hypertension Sister    Cancer Brother    Hypertension Brother    Heart attack Brother    Pancreatitis Brother    Hypertension Sister    Myasthenia gravis Sister     Social History   Socioeconomic History   Marital status: Married    Spouse name: Not on file   Number of children: Not on file   Years of education: Not on file   Highest education level: 12th grade  Occupational History   Not on file  Tobacco Use   Smoking status: Never  Smokeless tobacco: Never  Vaping Use   Vaping Use: Never used  Substance and Sexual Activity   Alcohol use: Yes    Alcohol/week: 1.0 standard drink of alcohol    Types: 1 Glasses of wine per week    Comment: 4-5 times weekly   Drug use: No   Sexual activity: Not on file  Other Topics Concern   Not on file  Social History Narrative   Married, 1 son, 1 grand-daughter.   Orig from [place], then [place] for 30 yrs, relocated back to [place].   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   NO tob.  Alc: 1 glass wine 4-5 times per week.   Social Determinants of Health   Financial Resource Strain: Low Risk  (03/06/2022)   Overall Financial Resource Strain (CARDIA)    Difficulty of Paying Living Expenses: Not hard at all  Food Insecurity: No Food Insecurity (03/06/2022)   Hunger Vital Sign    Worried About Running Out of Food in the Last Year: Never true    Ran Out of Food in the Last Year: Never true   Transportation Needs: No Transportation Needs (03/06/2022)   PRAPARE - Hydrologist (Medical): No    Lack of Transportation (Non-Medical): No  Physical Activity: Sufficiently Active (03/06/2022)   Exercise Vital Sign    Days of Exercise per Week: 4 days    Minutes of Exercise per Session: 40 min  Stress: No Stress Concern Present (03/06/2022)   [place]    Feeling of Stress : Not at all  Social Connections: Moderately Integrated (03/06/2022)   Social Connection and Isolation Panel [NHANES]    Frequency of Communication with Friends and Family: More than three times a week    Frequency of Social Gatherings with Friends and Family: More than three times a week    Attends Religious Services: More than 4 times per year    Active Member of [employer] or Organizations: No    Attends Archivist Meetings: Never    Marital Status: Married  Human resources officer Violence: Not At Risk (03/06/2022)   Humiliation, Afraid, Rape, and Kick questionnaire    Fear of Current or Ex-Partner: No    Emotionally Abused: No    Physically Abused: No    Sexually Abused: No    Outpatient Medications Prior to Visit  Medication Sig Dispense Refill   irbesartan (AVAPRO) 75 MG tablet TAKE 1/2 TABLET QD 45 tablet 3   LORazepam (ATIVAN) 0.5 MG tablet TAKE 1 TABLET BY MOUTH TWICE A DAY AS NEEDED 60 tablet 1   OVER THE COUNTER MEDICATION daily. Vitamin B12-Vitamin D3- Vitamin C     PARoxetine (PAXIL) 10 MG tablet TAKE 1 TABLET BY MOUTH EVERY DAY 30 tablet 0   atorvastatin (LIPITOR) 40 MG tablet Take 1 tablet (40 mg total) by mouth daily. 90 tablet 3   No facility-administered medications prior to visit.    Allergies  Allergen Reactions   Citalopram Other (See Comments)    Heatwaves/hot flashes   Lisinopril Other (See Comments)    Jaw pain   Simvastatin Other (See Comments)    Orthostatic dizziness    ROS Review  of Systems  Constitutional:  Negative for appetite change, chills, fatigue and fever.  HENT:  Negative for congestion, dental problem, ear pain and sore throat.   Eyes:  Negative for discharge, redness and visual  disturbance.  Respiratory:  Negative for cough, chest tightness, shortness of breath and wheezing.   Cardiovascular:  Negative for chest pain, palpitations and leg swelling.  Gastrointestinal:  Negative for abdominal pain, blood in stool, diarrhea, nausea and vomiting.  Genitourinary:  Negative for difficulty urinating, dysuria, flank pain, frequency, hematuria and urgency.  Musculoskeletal:  Negative for arthralgias, back pain, joint swelling, myalgias and neck stiffness.  Skin:  Negative for pallor and rash.  Neurological:  Negative for dizziness, speech difficulty, weakness and headaches.  Hematological:  Negative for adenopathy. Does not bruise/bleed easily.  Psychiatric/Behavioral:  Negative for confusion and sleep disturbance. The patient is not nervous/anxious.     PE;    10/29/2022    8:17 AM 04/11/2022    9:47 AM 11/22/2021    2:20 PM  Vitals with BMI  Height 5' 2.008" '5\' 2"'$    Weight 133 lbs 13 oz 147 lbs 13 oz   BMI 23.76 28.31   Systolic 517 616 073  Diastolic 76 72 80  Pulse 57 57      Exam chaperoned by Deveron Furlong, CMA. Gen: Alert, well appearing.  Patient is oriented to person, place, time, and situation. AFFECT: pleasant, lucid thought and speech. ENT: Ears: EACs clear, normal epithelium.  TMs with good light reflex and landmarks bilaterally.  Eyes: no injection, icteris, swelling, or exudate.  EOMI, PERRLA. Nose: no drainage or turbinate edema/swelling.  No injection or focal lesion.  Mouth: lips without lesion/swelling.  Oral mucosa pink and moist.  Dentition intact and without obvious caries or gingival swelling.  Oropharynx without erythema, exudate, or swelling.  Neck: supple/nontender.  No LAD, mass, or TM.  Carotid pulses 2+ bilaterally, without  bruits. CV: RRR, no m/r/g.   LUNGS: CTA bilat, nonlabored resps, good aeration in all lung fields. ABD: soft, NT, ND, BS normal.  No hepatospenomegaly or mass.  No bruits. EXT: no clubbing, cyanosis, or edema.  Musculoskeletal: no joint swelling, erythema, warmth, or tenderness.  ROM of all joints intact. Skin - no sores or suspicious lesions or rashes or color changes  Pertinent labs:  Lab Results  Component Value Date   TSH CANCELED 10/11/2021   Lab Results  Component Value Date   WBC 6.3 10/11/2021   HGB 13.6 10/11/2021   HCT 40.4 10/11/2021   MCV 89.8 10/11/2021   PLT 177 10/11/2021   Lab Results  Component Value Date   CREATININE 0.76 04/11/2022   BUN 13 04/11/2022   NA 140 04/11/2022   K 4.2 04/11/2022   CL 105 04/11/2022   CO2 23 04/11/2022   Lab Results  Component Value Date   ALT 20 04/11/2022   AST 19 04/11/2022   ALKPHOS 82 06/12/2020   BILITOT 1.0 04/11/2022   Lab Results  Component Value Date   CHOL 169 04/11/2022   Lab Results  Component Value Date   HDL 41 (L) 04/11/2022   Lab Results  Component Value Date   LDLCALC 103 (H) 04/11/2022   Lab Results  Component Value Date   TRIG 146 04/11/2022   Lab Results  Component Value Date   CHOLHDL 4.1 04/11/2022   ASSESSMENT AND PLAN:   #1 health maintenance exam: Reviewed age and gender appropriate health maintenance issues (prudent diet, regular exercise, health risks of tobacco and excessive alcohol, use of seatbelts, fire alarms in home, use of sunscreen).  Also reviewed age and gender appropriate health screening as well as vaccine recommendations. Vaccines: all UTD. Labs: fasting HP labs Cervical ca  screening: remote hx of hysterectomy for DUB, no further paps indicated. Breast ca screening: mammogram scheduled. Colon ca screening: hx of adenomas, recall 05/2027. Osteoporosis screening: last DEXA 04/2019, osteopenia, [facility]rpt [facility]20[facility] was stable--GYN plans rpt 2 yrs.  She is  taking Ca++, 1000 U vit D daily.  #2 essential hypertension, well-controlled on half of an irbesartan 75 mg tab a day. Lecture lites and creatinine today.  Hyperlipidemia, doing well on atorvastatin 40 mg a day. Lipid panel and hepatic panel today.  4.  GAD.  Doing great on Paxil 10 mg a day.  She uses lorazepam infrequently.  An After Visit Summary was printed and given to the patient.  FOLLOW UP:  No follow-ups on file.  Signed:  Crissie Sickles, MD           10/29/2022

## 2022-10-29 NOTE — Patient Instructions (Signed)

## 2022-10-30 LAB — LIPID PANEL
Cholesterol: 157 mg/dL (ref <200)
HDL: 52 mg/dL (ref >=50)
LDL Cholesterol (Calc): 82 mg/dL (calc)
Non-HDL Cholesterol (Calc): 105 mg/dL (calc) (ref <130)
Total CHOL/HDL Ratio: 3 (calc) (ref <5.0)
Triglycerides: 125 mg/dL (ref <150)

## 2022-10-30 LAB — COMPREHENSIVE METABOLIC PANEL
AG Ratio: 2 (calc) (ref 1.0–2.5)
ALT: 17 U/L (ref 6–29)
AST: 18 U/L (ref 10–35)
Albumin: 4.3 g/dL (ref 3.6–5.1)
Alkaline phosphatase (APISO): 78 U/L (ref 37–153)
BUN: 10 mg/dL (ref 7–25)
CO2: 28 mmol/L (ref 20–32)
Calcium: 9.7 mg/dL (ref 8.6–10.4)
Chloride: 103 mmol/L (ref 98–110)
Creat: 0.83 mg/dL (ref 0.60–1.00)
Globulin: 2.1 g/dL (calc) (ref 1.9–3.7)
Glucose, Bld: 92 mg/dL (ref 65–99)
Potassium: 4.5 mmol/L (ref 3.5–5.3)
Sodium: 140 mmol/L (ref 135–146)
Total Bilirubin: 1.1 mg/dL (ref 0.2–1.2)
Total Protein: 6.4 g/dL (ref 6.1–8.1)

## 2022-10-30 LAB — TSH: TSH: 2.5 mIU/L (ref 0.40–4.50)

## 2022-10-30 LAB — CBC
HCT: 41.4 % (ref 35.0–45.0)
Hemoglobin: 14.6 g/dL (ref 11.7–15.5)
MCH: 31.1 pg (ref 27.0–33.0)
MCHC: 35.3 g/dL (ref 32.0–36.0)
MCV: 88.1 fL (ref 80.0–100.0)
MPV: 9.4 fL (ref 7.5–12.5)
Platelets: 195 10*3/uL (ref 140–400)
RBC: 4.7 10*6/uL (ref 3.80–5.10)
RDW: 12.3 % (ref 11.0–15.0)
WBC: 6.4 10*3/uL (ref 3.8–10.8)

## 2022-11-19 DIAGNOSIS — Z1231 Encounter for screening mammogram for malignant neoplasm of breast: Secondary | ICD-10-CM | POA: Diagnosis not present

## 2022-11-19 DIAGNOSIS — Z1272 Encounter for screening for malignant neoplasm of vagina: Secondary | ICD-10-CM | POA: Diagnosis not present

## 2022-11-19 DIAGNOSIS — Z01419 Encounter for gynecological examination (general) (routine) without abnormal findings: Secondary | ICD-10-CM | POA: Diagnosis not present

## 2022-11-19 DIAGNOSIS — Z6824 Body mass index (BMI) 24.0-24.9, adult: Secondary | ICD-10-CM | POA: Diagnosis not present

## 2022-11-19 DIAGNOSIS — R69 Illness, unspecified: Secondary | ICD-10-CM | POA: Diagnosis not present

## 2022-12-05 ENCOUNTER — Other Ambulatory Visit: Payer: Self-pay | Admitting: Family Medicine

## 2023-03-12 ENCOUNTER — Telehealth: Payer: Self-pay

## 2023-03-12 ENCOUNTER — Ambulatory Visit: Payer: Medicare HMO

## 2023-03-12 NOTE — Telephone Encounter (Signed)
Called pt to schedule AWV. Please schedule with health coach or Allene Furuya.  

## 2023-04-25 NOTE — Patient Instructions (Signed)

## 2023-04-28 ENCOUNTER — Encounter: Payer: Self-pay | Admitting: Family Medicine

## 2023-04-28 ENCOUNTER — Ambulatory Visit (INDEPENDENT_AMBULATORY_CARE_PROVIDER_SITE_OTHER): Payer: Medicare HMO | Admitting: Family Medicine

## 2023-04-28 ENCOUNTER — Ambulatory Visit (INDEPENDENT_AMBULATORY_CARE_PROVIDER_SITE_OTHER): Payer: Medicare HMO

## 2023-04-28 VITALS — BP 132/82 | HR 52 | Wt 131.8 lb

## 2023-04-28 DIAGNOSIS — F411 Generalized anxiety disorder: Secondary | ICD-10-CM | POA: Diagnosis not present

## 2023-04-28 DIAGNOSIS — Z79899 Other long term (current) drug therapy: Secondary | ICD-10-CM

## 2023-04-28 DIAGNOSIS — E7800 Pure hypercholesterolemia, unspecified: Secondary | ICD-10-CM | POA: Diagnosis not present

## 2023-04-28 DIAGNOSIS — I10 Essential (primary) hypertension: Secondary | ICD-10-CM | POA: Diagnosis not present

## 2023-04-28 DIAGNOSIS — Z0000 Encounter for general adult medical examination without abnormal findings: Secondary | ICD-10-CM | POA: Diagnosis not present

## 2023-04-28 DIAGNOSIS — Z23 Encounter for immunization: Secondary | ICD-10-CM | POA: Diagnosis not present

## 2023-04-28 LAB — COMPREHENSIVE METABOLIC PANEL
ALT: 25 U/L (ref 0–35)
AST: 20 U/L (ref 0–37)
Albumin: 4.2 g/dL (ref 3.5–5.2)
Alkaline Phosphatase: 89 U/L (ref 39–117)
BUN: 10 mg/dL (ref 6–23)
CO2: 27 mEq/L (ref 19–32)
Calcium: 9.2 mg/dL (ref 8.4–10.5)
Chloride: 99 mEq/L (ref 96–112)
Creatinine, Ser: 0.79 mg/dL (ref 0.40–1.20)
GFR: 73.34 mL/min (ref >60.00)
Glucose, Bld: 96 mg/dL (ref 70–99)
Potassium: 4.4 mEq/L (ref 3.5–5.1)
Sodium: 134 mEq/L — ABNORMAL LOW (ref 135–145)
Total Bilirubin: 1.1 mg/dL (ref 0.2–1.2)
Total Protein: 6.5 g/dL (ref 6.0–8.3)

## 2023-04-28 LAB — LIPID PANEL
Cholesterol: 163 mg/dL (ref 0–200)
HDL: 55.6 mg/dL (ref >39.00)
LDL Cholesterol: 91 mg/dL (ref 0–99)
NonHDL: 107.5
Total CHOL/HDL Ratio: 3
Triglycerides: 82 mg/dL (ref 0.0–149.0)
VLDL: 16.4 mg/dL (ref 0.0–40.0)

## 2023-04-28 MED ORDER — LORAZEPAM 0.5 MG PO TABS: 0.5000 mg | ORAL_TABLET | Freq: Two times a day (BID) | ORAL | 1 refills | Status: DC | PRN

## 2023-04-28 MED ORDER — IRBESARTAN 75 MG PO TABS: ORAL_TABLET | ORAL | 1 refills | Status: DC

## 2023-04-28 NOTE — Patient Instructions (Signed)

## 2023-04-28 NOTE — Progress Notes (Signed)
Subjective:   Kathy Richardson is a 77 y.o. female who presents for Medicare Annual (Subsequent) preventive examination.  Review of Systems    Defer to PCP       Objective:    Today's Vitals   04/28/23 1041  BP: 132/82  Pulse: (!) 52  SpO2: 98%  Weight: 131 lb 12.8 oz (59.8 kg)   Body mass index is 24.1 kg/m.     04/28/2023   10:36 AM 03/06/2022    9:30 AM 09/13/2020    9:06 AM 02/17/2018   10:13 AM 02/03/2017    8:49 AM 06/11/2015    3:45 PM  Advanced Directives  Does Patient Have a Medical Advance Directive? Yes Yes Yes Yes Yes No  Type of Advance Directive Living will;Healthcare Power of [employer] Power of [employer] Power of [place];Living will Healthcare Power of [place];Living will Healthcare Power of [place];Living will   Does patient want to make changes to medical advance directive? No - Patient declined       Copy of Healthcare Power of Attorney in Chart? No - copy requested No - copy requested No - copy requested No - copy requested No - copy requested     Current Medications (verified) Outpatient Encounter Medications as of 04/28/2023  Medication Sig   atorvastatin (LIPITOR) 40 MG tablet Take 40 mg by mouth daily.   OVER THE COUNTER MEDICATION daily. Vitamin B12-Vitamin D3- Vitamin C   PARoxetine (PAXIL) 10 MG tablet Take 1 tablet (10 mg total) by mouth daily. (Patient taking differently: Take 10 mg by mouth every other day.)   [DISCONTINUED] irbesartan (AVAPRO) 75 MG tablet TAKE 1/2 TABLET BY MOUTH TWICE A DAY (Patient taking differently: TAKE 1/2 TABLET BY MOUTH ONCE A DAY)   [DISCONTINUED] irbesartan (AVAPRO) 75 MG tablet TAKE 1/2 TABLET BY MOUTH ONCE A DAY   [DISCONTINUED] LORazepam (ATIVAN) 0.5 MG tablet TAKE 1 TABLET BY MOUTH TWICE A DAY AS NEEDED   No facility-administered encounter medications on file as of 04/28/2023.    Allergies (verified) Citalopram, Lisinopril, and Simvastatin   History: Past Medical History:  Diagnosis Date    Allergic rhinitis    Basal cell carcinoma    X 3: one on each leg and one on right shoulder   Carpal tunnel syndrome on both sides    11/17/19 NCS/EMG; mild.  Dr. Merlyn Lot   Diverticulosis    Noted on colonoscopies   Family history of colon cancer in father    Age 9.  Next colonoscopy due 04/2022.   Generalized anxiety disorder    History of adenomatous polyp of colon 2008; 2013; 04/2017   Most recent 05/2022--Recall 5 yrs (Dr. Marcos Eke w/[facility])   Hyperlipidemia, mixed    simva 2016; atorva 05/2019   Hypertension    Mitral valve prolapse    Osteoarthritis    Fingers and hips.   Osteopenia    DEXA 03/2019. T-score -2.0-->rpt 2 yrs (Dr. Renaldo Fiddler)   Palpitations    3d event monitor normal 08/26/19.  +Bradycardia and PVCs, followed by cardiology (BB d/c'd 01/2020).   Raynaud phenomenon    Past Surgical History:  Procedure Laterality Date   ABDOMINAL HYSTERECTOMY  1979   Done for DUB.  Ovaries are still in.  No further paps necessary.   APPENDECTOMY  1979   CARDIOVASCULAR STRESS TEST  2007   Normal stress echo (in [place])   CAROTID DOPPLERS  08/11/2020   normal (cards, Dr. Chales Abrahams)   CATARACT EXTRACTION  07/2013  COLONOSCOPY  12/2011   Recall 5 yrs   COLONOSCOPY  04/21/2017   Tubular adenoma x 1. Repeat in 5 years (Dr. [facility])   COLONOSCOPY  06/10/2022   adenoma x1. Recall 5 yrs   DEXA  02/2017; 03/2019   03/2019 T-score -2.0.  Repeat 2 yrs (Dr. Vickey [facility]).   Event monitor  08/26/2019   74 H->no adverse arrhythmias.  Occ PVC and PAT.   TRANSTHORACIC ECHOCARDIOGRAM  08/05/2019   EF 65-70%, grd I DD, mild MR.   Family History  Problem Relation Age of Onset   Breast cancer Mother        dx'd age 37   Heart attack Mother    Heart disease Mother    Stroke Father    Hypertension Father    Cancer Father    Hypertension Sister    Cancer Brother    Hypertension Brother    Heart attack Brother    Pancreatitis Brother    Hypertension Sister    Myasthenia gravis  Sister    Social History   Socioeconomic History   Marital status: Married    Spouse name: Not on file   Number of children: Not on file   Years of education: Not on file   Highest education level: 12th grade  Occupational History   Not on file  Tobacco Use   Smoking status: Never   Smokeless tobacco: Never  Vaping Use   Vaping Use: Never used  Substance and Sexual Activity   Alcohol use: Yes    Alcohol/week: 1.0 standard drink of alcohol    Types: 1 Glasses of wine per week    Comment: 4-5 times weekly   Drug use: No   Sexual activity: Not on file  Other Topics Concern   Not on file  Social History Narrative   Married, 1 son, 1 grand-daughter.   Orig from [place], then [place] for 30 yrs, relocated back to [facility] 2015.   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   NO tob.  Alc: 1 glass wine 4-5 times per week.   Social Determinants of Health   Financial Resource Strain: Low Risk  (04/27/2023)   Overall Financial Resource Strain (CARDIA)    Difficulty of Paying Living Expenses: Not hard at all  Food Insecurity: No Food Insecurity (04/27/2023)   Hunger Vital Sign    Worried About Running Out of Food in the Last Year: Never true    Ran Out of Food in the Last Year: Never true  Transportation Needs: No Transportation Needs (04/27/2023)   PRAPARE - Administrator, Civil Service (Medical): No    Lack of Transportation (Non-Medical): No  Physical Activity: Insufficiently Active (04/27/2023)   Exercise Vital Sign    Days of Exercise per Week: 4 days    Minutes of Exercise per Session: 30 min  Stress: No Stress Concern Present (04/27/2023)   [employer] of Occupational Health - Occupational Stress Questionnaire    Feeling of Stress : Only a little  Social Connections: Moderately Integrated (04/27/2023)   Social Connection and Isolation Panel [NHANES]    Frequency of Communication with Friends and Family: More than three  times a week    Frequency of Social Gatherings with Friends and Family: Once a week    Attends Religious Services: More than 4 times per year    Active Member of [employer] or Organizations: No    Attends Club or  Organization Meetings: Never    Marital Status: Married    Tobacco Counseling Counseling given: Not Answered   Clinical Intake:     Pain : No/denies pain     Nutritional Status: BMI of 19-24  Normal Diabetes: No  How often do you need to have someone help you when you read instructions, pamphlets, or other written materials from your doctor or pharmacy?: 1 - Never  Diabetic?no         Activities of Daily Living    04/27/2023    7:31 AM  In your present state of health, do you have any difficulty performing the following activities:  Hearing? 0  Vision? 0  Difficulty concentrating or making decisions? 0  Walking or climbing stairs? 0  Dressing or bathing? 0  Doing errands, shopping? 0  Preparing Food and eating ? N  Using the Toilet? N  In the past six months, have you accidently leaked urine? N  Do you have problems with loss of bowel control? N  Managing your Medications? N  Managing your Finances? N  Housekeeping or managing your Housekeeping? N    Patient Care Team: Jeoffrey Massed, MD as PCP - General (Family Medicine) Sinda Du, MD as Consulting Physician (Ophthalmology) Zelphia Cairo, MD as Consulting Physician (Obstetrics and Gynecology) Kerin Salen, MD as Consulting Physician (Gastroenterology) Bettey Costa, MD as Consulting Physician (Dermatology) Chales Abrahams, Archie Renford Dills., MD as Consulting Physician (Cardiology)  Indicate any recent Medical Services you may have received from other than [facility] providers in the past year (date may be approximate).     Assessment:   This is a routine wellness examination for Safire.  Hearing/Vision screen No results found.  Dietary issues and exercise activities discussed:     Goals  Addressed               This Visit's Progress     Patient Stated (pt-stated)        Eat healthy and lose more weight      Depression Screen    04/28/2023   10:34 AM 04/28/2023    9:59 AM 10/29/2022    8:22 AM 04/11/2022    9:50 AM 03/06/2022    9:29 AM 10/11/2021    8:50 AM 09/13/2020    9:09 AM  PHQ 2/9 Scores  PHQ - 2 Score 0 0 0 0 0 0 0  PHQ- 9 Score 0 0         Fall Risk    04/28/2023    9:59 AM 04/27/2023    2:59 PM 04/27/2023    7:31 AM 10/29/2022    8:22 AM 04/11/2022    9:50 AM  Fall Risk   Falls in the past year? 0 0 0 0 0  Number falls in past yr:   0 0 0  Injury with Fall?   0 0 0  Risk for fall due to :    No Fall Risks Impaired vision  Follow up    Falls evaluation completed Falls evaluation completed    FALL RISK PREVENTION PERTAINING TO THE HOME:  Any stairs in or around the home? Yes  If so, are there any without handrails? No  Home free of loose throw rugs in walkways, pet beds, electrical cords, etc? Yes  Adequate lighting in your home to reduce risk of falls? Yes   ASSISTIVE DEVICES UTILIZED TO PREVENT FALLS:  Life alert? No  Use of a cane, walker or w/c? No  Grab bars in  the bathroom? No  Shower chair or bench in shower? Yes  Elevated toilet seat or a handicapped toilet? No   TIMED UP AND GO:  Was the test performed? Yes .  Length of time to ambulate 10 feet: 5 sec.   Gait steady and fast without use of assistive device  Cognitive Function:    02/17/2018   10:14 AM  MMSE - Mini Mental State Exam  Orientation to time 5  Orientation to Place 5  Registration 3  Attention/ Calculation 5  Recall 3  Language- name 2 objects 2  Language- repeat 1  Language- follow 3 step command 3  Language- read & follow direction 1  Write a sentence 1  Copy design 1  Total score 30        04/28/2023   10:29 AM 03/06/2022    9:33 AM  6CIT Screen  What Year? 0 points 0 points  What month? 0 points 0 points  What time? 0 points 0 points  Count  back from 20 0 points 0 points  Months in reverse 0 points 0 points  Repeat phrase 0 points 0 points  Total Score 0 points 0 points    Immunizations Immunization History  Administered Date(s) Administered   Fluad Quad(high Dose 65+) 07/29/2019, 09/13/2020, 09/10/2022   Influenza, High Dose Seasonal PF 09/01/2018, 07/29/2019   Influenza-Unspecified 08/08/2014, 08/16/2016, 08/26/2019, 08/31/2021   PFIZER Comirnaty(Gray Top)Covid-19 Tri-Sucrose Vaccine 05/05/2021   PFIZER(Purple Top)SARS-COV-2 Vaccination 12/06/2019, 01/03/2020, 08/07/2020   Pneumococcal Conjugate-13 09/29/2020   Pneumococcal Polysaccharide-23 09/27/2019   Tdap 11/30/2014   Zoster Recombinat (Shingrix) 07/02/2018, 11/12/2018   Zoster, Live 11/12/2018    TDAP status: Up to date  Flu Vaccine status: Up to date  Pneumococcal vaccine status: Up to date  Covid-19 vaccine status: Completed vaccines  Qualifies for Shingles Vaccine? Yes   Zostavax completed No   Shingrix Completed?: Yes  Screening Tests Health Maintenance  Topic Date Due   COVID-19 Vaccine (5 - 2023-24 season) 05/11/2023 (Originally 07/12/2022)   INFLUENZA VACCINE  06/12/2023   DEXA SCAN  09/11/2023   Medicare Annual Wellness (AWV)  04/27/2024   DTaP/Tdap/Td (2 - Td or Tdap) 11/30/2024   Colonoscopy  06/11/2027   Pneumonia Vaccine 55+ Years old  Completed   Zoster Vaccines- Shingrix  Completed   HPV VACCINES  Aged Out   Hepatitis C Screening  Discontinued    Health Maintenance  There are no preventive care reminders to display for this patient.   Colorectal cancer screening: Type of screening: Colonoscopy. Completed 06/10/22. Repeat every 5 years  Mammogram status: No longer required due to age.  Bone Density status: Completed 09/10/21. Results reflect: Bone density results: OSTEOPENIA. Repeat every 2 years.  Lung Cancer Screening: (Low Dose CT Chest recommended if Age 89-80 years, 30 pack-year currently smoking OR have quit w/in  15years.) does not qualify.   Lung Cancer Screening Referral: n/a  Additional Screening:  Hepatitis C Screening: does qualify; Completed n/a  Vision Screening: Recommended annual ophthalmology exams for early detection of glaucoma and other disorders of the eye. Is the patient up to date with their annual eye exam?  No  Who is the provider or what is the name of the office in which the patient attends annual eye exams? Dr. Sherrine Maples If pt is not established with a provider, would they like to be referred to a provider to establish care? No .   Dental Screening: Recommended annual dental exams for proper oral hygiene  [employer]  Referral / Chronic Care Management: CRR required this visit?  No   CCM required this visit?  No      Plan:     I have personally reviewed and noted the following in the patient's chart:   Medical and social history Use of alcohol, tobacco or illicit drugs  Current medications and supplements including opioid prescriptions. Patient is not currently taking opioid prescriptions. Functional ability and status Nutritional status Physical activity Advanced directives List of other physicians Hospitalizations, surgeries, and ER visits in previous 12 months Vitals Screenings to include cognitive, depression, and falls Referrals and appointments  In addition, I have reviewed and discussed with patient certain preventive protocols, quality metrics, and best practice recommendations. A written personalized care plan for preventive services as well as general preventive health recommendations were provided to patient.     Filomena Jungling, CMA   04/28/2023   Nurse Notes:  Ms. Biers , Thank you for taking time to come for your Medicare Wellness Visit. I appreciate your ongoing commitment to your health goals. Please review the following plan we discussed and let me know if I can assist you in the future.   These are the goals we discussed:  Goals        Patient Stated      Maintain current health by staying active.       Patient Stated      Lose 10 lbs      Patient Stated (pt-stated)      Eat healthy and lose more weight        This is a list of the screening recommended for you and due dates:  Health Maintenance  Topic Date Due   COVID-19 Vaccine (5 - 2023-24 season) 05/11/2023*   Flu Shot  06/12/2023   DEXA scan (bone density measurement)  09/11/2023   Medicare Annual Wellness Visit  04/27/2024   DTaP/Tdap/Td vaccine (2 - Td or Tdap) 11/30/2024   Colon Cancer Screening  06/11/2027   Pneumonia Vaccine  Completed   Zoster (Shingles) Vaccine  Completed   HPV Vaccine  Aged Out   Hepatitis C Screening  Discontinued  *Topic was postponed. The date shown is not the original due date.   Face to Face 5 minute visit Encounter

## 2023-04-28 NOTE — Addendum Note (Signed)
Addended by: Arty Baumgartner A on: 04/28/2023 11:57 AM   Modules accepted: Orders

## 2023-04-28 NOTE — Progress Notes (Signed)
OFFICE VISIT  04/28/2023  CC:  Chief Complaint  Patient presents with   Medical Management of Chronic Issues    Patient is a 77 y.o. female who presents for 11-month follow-up hypertension, hyperlipidemia, and GAD. A/P as of last visit: "#1 health maintenance exam: Reviewed age and gender appropriate health maintenance issues (prudent diet, regular exercise, health risks of tobacco and excessive alcohol, use of seatbelts, fire alarms in home, use of sunscreen).  Also reviewed age and gender appropriate health screening as well as vaccine recommendations. Vaccines: all UTD. Labs: fasting HP labs Cervical ca screening: remote hx of hysterectomy for DUB, no further paps indicated. Breast ca screening: mammogram scheduled. Colon ca screening: hx of adenomas, recall 05/2027. Osteoporosis screening: last DEXA 04/2019, osteopenia, [facility]->rpt 08/2021 was stable--GYN plans rpt 2 yrs.  She is taking Ca++, 1000 U vit D daily.   #2 essential hypertension, well-controlled on half of an irbesartan 75 mg tab a day. Lecture lites and creatinine today.   Hyperlipidemia, doing well on atorvastatin 40 mg a day. Lipid panel and hepatic panel today.   4.  GAD.  Doing great on Paxil 10 mg a day.  She uses lorazepam infrequently."  INTERIM HX: [place] feels well. She remains very active, traveling. Anxiety well-controlled.  Mood is good.  PMP AWARE reviewed today: most recent rx for lorazepam was filled 01/28/2023, # 60, rx by me. No red flags.  Past Medical History:  Diagnosis Date   Allergic rhinitis    Basal cell carcinoma    X 3: one on each leg and one on right shoulder   Carpal tunnel syndrome on both sides    11/17/19 NCS/EMG; mild.  Dr. Merlyn Lot   Diverticulosis    Noted on colonoscopies   Family history of colon cancer in father    Age 64.  Next colonoscopy due 04/2022.   Generalized anxiety disorder    History of adenomatous polyp of colon 2008; 2013; 04/2017   Most recent  05/2022--Recall 5 yrs (Dr. Marcos Eke w/[facility])   Hyperlipidemia, mixed    simva 2016; atorva 05/2019   Hypertension    Mitral valve prolapse    Osteoarthritis    Fingers and hips.   Osteopenia    DEXA 03/2019. T-score -2.0-->rpt 2 yrs (Dr. Renaldo Fiddler)   Palpitations    3d event monitor normal 08/26/19.  +Bradycardia and PVCs, followed by cardiology (BB d/c'd 01/2020).   Raynaud phenomenon     Past Surgical History:  Procedure Laterality Date   ABDOMINAL HYSTERECTOMY  1979   Done for DUB.  Ovaries are still in.  No further paps necessary.   APPENDECTOMY  1979   CARDIOVASCULAR STRESS TEST  2007   Normal stress echo (in [place])   CAROTID DOPPLERS  08/11/2020   normal (cards, Dr. Chales Abrahams)   CATARACT EXTRACTION  07/2013   COLONOSCOPY  12/2011   Recall 5 yrs   COLONOSCOPY  04/21/2017   Tubular adenoma x 1. Repeat in 5 years (Dr. [facility])   COLONOSCOPY  06/10/2022   adenoma x1. Recall 5 yrs   DEXA  02/2017; 03/2019   03/2019 T-score -2.0.  Repeat 2 yrs (Dr. Vickey Sages, [facility]).   Event monitor  08/26/2019   74 H->no adverse arrhythmias.  Occ PVC and PAT.   TRANSTHORACIC ECHOCARDIOGRAM  08/05/2019   EF 65-70%, grd I DD, mild MR.    Outpatient Medications Prior to Visit  Medication Sig Dispense Refill   atorvastatin (LIPITOR) 40 MG tablet Take  40 mg by mouth daily.     OVER THE COUNTER MEDICATION daily. Vitamin B12-Vitamin D3- Vitamin C     PARoxetine (PAXIL) 10 MG tablet Take 1 tablet (10 mg total) by mouth daily. (Patient taking differently: Take 10 mg by mouth every other day.) 90 tablet 3   irbesartan (AVAPRO) 75 MG tablet TAKE 1/2 TABLET BY MOUTH TWICE A DAY (Patient taking differently: TAKE 1/2 TABLET BY MOUTH ONCE A DAY) 90 tablet 1   LORazepam (ATIVAN) 0.5 MG tablet TAKE 1 TABLET BY MOUTH TWICE A DAY AS NEEDED 60 tablet 1   No facility-administered medications prior to visit.    Allergies  Allergen Reactions   Citalopram Other (See Comments)    Heatwaves/hot flashes    Lisinopril Other (See Comments)    Jaw pain   Simvastatin Other (See Comments)    Orthostatic dizziness    Review of Systems As per HPI  PE:    04/28/2023   10:03 AM 04/28/2023    9:56 AM 10/29/2022    8:17 AM  Vitals with BMI  Height   5' 2.008"  Weight  131 lbs 13 oz 133 lbs 13 oz  BMI   24.47  Systolic 132 146 161  Diastolic 82 74 76  Pulse  52 57   Initial bp today 146/74 Rpt manual 132/82  Physical Exam  Gen: Alert, well appearing.  Patient is oriented to person, place, time, and situation. AFFECT: pleasant, lucid thought and speech. CV: RRR, no m/r/g.   LUNGS: CTA bilat, nonlabored resps, good aeration in all lung fields. EXT: no clubbing or cyanosis.  no edema.    LABS:  Last CBC Lab Results  Component Value Date   WBC 6.4 10/29/2022   HGB 14.6 10/29/2022   HCT 41.4 10/29/2022   MCV 88.1 10/29/2022   MCH 31.1 10/29/2022   RDW 12.3 10/29/2022   PLT 195 10/29/2022   Last metabolic panel Lab Results  Component Value Date   GLUCOSE 92 10/29/2022   NA 140 10/29/2022   K 4.5 10/29/2022   CL 103 10/29/2022   CO2 28 10/29/2022   BUN 10 10/29/2022   CREATININE 0.83 10/29/2022   CALCIUM 9.7 10/29/2022   PROT 6.4 10/29/2022   ALBUMIN 4.1 06/12/2020   BILITOT 1.1 10/29/2022   ALKPHOS 82 06/12/2020   AST 18 10/29/2022   ALT 17 10/29/2022   Last lipids Lab Results  Component Value Date   CHOL 157 10/29/2022   HDL 52 10/29/2022   LDLCALC 82 10/29/2022   TRIG 125 10/29/2022   CHOLHDL 3.0 10/29/2022   Last thyroid functions Lab Results  Component Value Date   TSH 2.50 10/29/2022   IMPRESSION AND PLAN:  #1 hypertension, well-controlled on one half of the irbesartan 75 mg twice daily. Check electrolytes and creatinine today.  2.  Hypercholesterolemia, doing well on atorvastatin 40 mg a day. Lipid panel and hepatic panel today.  3.  GAD, doing well on Paxil 10 mg every other day and lorazepam 0.5 mg twice daily as needed (#60, RF x 1).  An  After Visit Summary was printed and given to the patient.  FOLLOW UP: Return in about 6 months (around 10/28/2023) for annual CPE (fasting). Next CPE 10/2023 Signed:  Santiago Bumpers, MD           04/28/2023

## 2023-04-30 ENCOUNTER — Ambulatory Visit: Payer: Medicare HMO | Admitting: Family Medicine

## 2023-05-02 ENCOUNTER — Other Ambulatory Visit: Payer: Self-pay | Admitting: Family Medicine

## 2023-05-02 NOTE — Telephone Encounter (Signed)
Please fill, if appropriate. Prior rx by historical provider. Next OV 10/28/23

## 2023-05-13 DIAGNOSIS — L814 Other melanin hyperpigmentation: Secondary | ICD-10-CM | POA: Diagnosis not present

## 2023-05-13 DIAGNOSIS — L813 Cafe au lait spots: Secondary | ICD-10-CM | POA: Diagnosis not present

## 2023-05-13 DIAGNOSIS — C44519 Basal cell carcinoma of skin of other part of trunk: Secondary | ICD-10-CM | POA: Diagnosis not present

## 2023-05-13 DIAGNOSIS — L821 Other seborrheic keratosis: Secondary | ICD-10-CM | POA: Diagnosis not present

## 2023-05-13 DIAGNOSIS — Z85828 Personal history of other malignant neoplasm of skin: Secondary | ICD-10-CM | POA: Diagnosis not present

## 2023-05-13 DIAGNOSIS — D225 Melanocytic nevi of trunk: Secondary | ICD-10-CM | POA: Diagnosis not present

## 2023-05-13 DIAGNOSIS — L570 Actinic keratosis: Secondary | ICD-10-CM | POA: Diagnosis not present

## 2023-05-13 DIAGNOSIS — D2262 Melanocytic nevi of left upper limb, including shoulder: Secondary | ICD-10-CM | POA: Diagnosis not present

## 2023-05-13 DIAGNOSIS — C44619 Basal cell carcinoma of skin of left upper limb, including shoulder: Secondary | ICD-10-CM | POA: Diagnosis not present

## 2023-05-13 DIAGNOSIS — C44622 Squamous cell carcinoma of skin of right upper limb, including shoulder: Secondary | ICD-10-CM | POA: Diagnosis not present

## 2023-05-13 DIAGNOSIS — D3613 Benign neoplasm of peripheral nerves and autonomic nervous system of lower limb, including hip: Secondary | ICD-10-CM | POA: Diagnosis not present

## 2023-07-10 DIAGNOSIS — D0461 Carcinoma in situ of skin of right upper limb, including shoulder: Secondary | ICD-10-CM | POA: Diagnosis not present

## 2023-07-10 DIAGNOSIS — Z85828 Personal history of other malignant neoplasm of skin: Secondary | ICD-10-CM | POA: Diagnosis not present

## 2023-08-10 ENCOUNTER — Ambulatory Visit
Admission: RE | Admit: 2023-08-10 | Discharge: 2023-08-10 | Disposition: A | Discharge status: Home / Self Care | Payer: Medicare HMO | Source: Ambulatory Visit

## 2023-08-10 VITALS — BP 134/77 | HR 76 | Temp 99.1°F | Resp 18 | Ht 62.0 in | Wt 132.0 lb

## 2023-08-10 DIAGNOSIS — R051 Acute cough: Secondary | ICD-10-CM

## 2023-08-10 DIAGNOSIS — J329 Chronic sinusitis, unspecified: Secondary | ICD-10-CM | POA: Diagnosis not present

## 2023-08-10 DIAGNOSIS — R0981 Nasal congestion: Secondary | ICD-10-CM

## 2023-08-10 DIAGNOSIS — J40 Bronchitis, not specified as acute or chronic: Secondary | ICD-10-CM | POA: Diagnosis not present

## 2023-08-10 MED ORDER — FLUTICASONE PROPIONATE 50 MCG/ACT NA SUSP: 1.0000 | Freq: Every day | NASAL | 0 refills | Status: DC

## 2023-08-10 MED ORDER — AMOXICILLIN-POT CLAVULANATE 500-125 MG PO TABS: 1.0000 | ORAL_TABLET | Freq: Two times a day (BID) | ORAL | 0 refills | Status: AC

## 2023-08-10 MED ORDER — BENZONATATE 100 MG PO CAPS: 100.0000 mg | ORAL_CAPSULE | Freq: Three times a day (TID) | ORAL | 0 refills | Status: DC

## 2023-08-10 NOTE — ED Provider Notes (Signed)
[facility]    CSN: [id number] Arrival date & time: 08/10/23  0846      History   Chief Complaint Chief Complaint  Patient presents with   Cough    Appt    HPI Kathy Richardson is a 77 y.o. female.   Patient presents today with a 2-day history of URI symptoms.  Reports last week she had mild congestion and sore throat that initially improved only to recur 4 to 5 days ago.  This has significantly worsened prompting evaluation.  Reports ongoing congestion, cough, sinus pressure.  Denies any chest pain, shortness of breath, nausea, vomiting.  She has tried over-the-counter medications including Mucinex and Robitussin without improvement of symptoms.  Denies any known sick contacts.  She has had COVID with last episode several years ago.  She has had COVID-19 vaccines.  She did take a COVID test when symptoms first began and it was negative.  She denies any recent antibiotics or steroids.  Denies history of diabetes, asthma, COPD.  She does not smoke.    Past Medical History:  Diagnosis Date   Allergic rhinitis    Basal cell carcinoma    X 3: one on each leg and one on right shoulder   Carpal tunnel syndrome on both sides    11/17/19 NCS/EMG; mild.  Dr. Merlyn Lot   Diverticulosis    Noted on colonoscopies   Family history of colon cancer in father    Age 67.  Next colonoscopy due 04/2022.   Generalized anxiety disorder    History of adenomatous polyp of colon 2008; 2013; 04/2017   Most recent 05/2022--Recall 5 yrs (Dr. Marcos Eke w/[facility])   Hyperlipidemia, mixed    simva 2016; atorva 05/2019   Hypertension    Mitral valve prolapse    Osteoarthritis    Fingers and hips.   Osteopenia    DEXA 03/2019. T-score -2.0-->rpt 2 yrs (Dr. Renaldo Fiddler)   Palpitations    3d event monitor normal 08/26/19.  +Bradycardia and PVCs, followed by cardiology (BB d/c'd 01/2020).   Raynaud phenomenon     Patient Active Problem List   Diagnosis Date Noted   Seborrheic keratosis 10/10/2021    Hemangioma of skin and subcutaneous tissue 10/10/2021   Hypercholesterolemia 09/27/2020   Bilateral carpal tunnel syndrome 11/15/2019   Cervical spondylosis without myelopathy 11/15/2019   Primary osteoarthritis of first carpometacarpal joint of right hand 11/15/2019   Health maintenance examination 11/30/2014    Past Surgical History:  Procedure Laterality Date   ABDOMINAL HYSTERECTOMY  1979   Done for DUB.  Ovaries are still in.  No further paps necessary.   APPENDECTOMY  1979   CARDIOVASCULAR STRESS TEST  2007   Normal stress echo (in [place])   CAROTID DOPPLERS  08/11/2020   normal (cards, Dr. Chales Abrahams)   CATARACT EXTRACTION  07/2013   COLONOSCOPY  12/2011   Recall 5 yrs   COLONOSCOPY  04/21/2017   Tubular adenoma x 1. Repeat in 5 years ([facility]. [facility])   COLONOSCOPY  06/10/2022   adenoma x1. Recall 5 yrs   DEXA  02/2017; 03/2019   03/2019 T-score -2.0.  Repeat 2 yrs (Dr. Vickey Sages, [facility]).   Event monitor  08/26/2019   74 H->no adverse arrhythmias.  Occ PVC and PAT.   TRANSTHORACIC ECHOCARDIOGRAM  08/05/2019   EF 65-70%, grd I DD, mild MR.    OB History   No obstetric history on file.      Home Medications  Prior to Admission medications   Medication Sig Start Date End Date Taking? Authorizing Provider  acetaminophen (TYLENOL) 500 MG tablet Take 500 mg by mouth every 6 (six) hours as needed.   Yes [provider]  amoxicillin-clavulanate (AUGMENTIN) 500-125 MG tablet Take 1 tablet by mouth in the morning and at bedtime for 7 days. 08/10/23 08/17/23 Yes Kolten Ryback K, PA-C  atorvastatin (LIPITOR) 40 MG tablet TAKE 1 TABLET BY MOUTH EVERY DAY 05/02/23  Yes McGowen, Maryjean Morn, MD  benzonatate (TESSALON) 100 MG capsule Take 1 capsule (100 mg total) by mouth every 8 (eight) hours. 08/10/23  Yes Cheron Coryell K, PA-C  fluticasone (FLONASE) 50 MCG/ACT nasal spray Place 1 spray into both nostrils daily. 08/10/23  Yes Finnigan Warriner, Noberto Retort, PA-C  irbesartan (AVAPRO) 75  MG tablet One half tab twice daily 04/28/23  Yes McGowen, Maryjean Morn, MD  UNABLE TO FIND Med Name: [facility] DM Max   Yes [provider]  LORazepam (ATIVAN) 0.5 MG tablet Take 1 tablet (0.5 mg total) by mouth 2 (two) times daily as needed. 04/28/23   McGowen, Maryjean Morn, MD  OVER THE COUNTER MEDICATION daily. Vitamin B12-Vitamin D3- Vitamin C    [provider]  PARoxetine (PAXIL) 10 MG tablet Take 1 tablet (10 mg total) by mouth daily. Patient taking differently: Take 10 mg by mouth every other day. 10/29/22   McGowen, Maryjean Morn, MD    Family History Family History  Problem Relation Age of Onset   Breast cancer Mother        dx'd age 21   Heart attack Mother    Heart disease Mother    Stroke Father    Hypertension Father    Cancer Father    Hypertension Sister    Cancer Brother    Hypertension Brother    Heart attack Brother    Pancreatitis Brother    Hypertension Sister    Myasthenia gravis Sister     Social History Social History   Tobacco Use   Smoking status: Never   Smokeless tobacco: Never  Vaping Use   Vaping status: Never Used  Substance Use Topics   Alcohol use: Yes    Alcohol/week: 1.0 standard drink of alcohol    Types: 1 Glasses of wine per week    Comment: 4-5 times weekly   Drug use: No     Allergies   Citalopram, Lisinopril, and Simvastatin   Review of Systems Review of Systems  Constitutional:  Positive for activity change. Negative for appetite change, fatigue and fever.  HENT:  Positive for congestion, postnasal drip, sore throat and trouble swallowing. Negative for sinus pressure, sneezing and voice change.   Respiratory:  Positive for cough. Negative for shortness of breath.   Cardiovascular:  Negative for chest pain.  Gastrointestinal:  Negative for abdominal pain, diarrhea, nausea and vomiting.  Neurological:  Positive for headaches. Negative for dizziness and light-headedness.     Physical Exam Triage Vital Signs ED  Triage Vitals  Encounter Vitals Group     BP 08/10/23 0905 (!) 144/79     Systolic BP Percentile --      Diastolic BP Percentile --      Pulse Rate 08/10/23 0905 79     Resp 08/10/23 0905 18     Temp 08/10/23 0905 99.1 F (37.3 C)     Temp Source 08/10/23 0905 Oral     SpO2 08/10/23 0905 98 %     Weight 08/10/23 0907 132 lb (59.9  kg)     Height 08/10/23 0907 5\' 2"  (1.575 m)     Head Circumference --      Peak Flow --      Pain Score 08/10/23 0906 0     Pain Loc --      Pain Education --      Exclude from Growth Chart --    No data found.  Updated Vital Signs BP 134/77 (BP Location: Left Arm)   Pulse 76   Temp 99.1 F (37.3 C) (Oral)   Resp 18   Ht 5\' 2"  (1.575 m)   Wt 132 lb (59.9 kg)   SpO2 98%   BMI 24.14 kg/m   Visual Acuity Right Eye Distance:   Left Eye Distance:   Bilateral Distance:    Right Eye Near:   Left Eye Near:    Bilateral Near:     Physical Exam Vitals reviewed.  Constitutional:      General: She is awake. She is not in acute distress.    Appearance: Normal appearance. She is well-developed. She is not ill-appearing.     Comments: Very pleasant female appear stated age in no acute distress sitting comfortably exam room  HENT:     Head: Normocephalic and atraumatic.     Right Ear: Tympanic membrane, ear canal and external ear normal. Tympanic membrane is not erythematous or bulging.     Left Ear: Tympanic membrane, ear canal and external ear normal. Tympanic membrane is not erythematous or bulging.     Nose:     Right Sinus: Maxillary sinus tenderness present. No frontal sinus tenderness.     Left Sinus: Maxillary sinus tenderness present. No frontal sinus tenderness.     Mouth/Throat:     Pharynx: Uvula midline. Posterior oropharyngeal erythema and postnasal drip present. No oropharyngeal exudate.  Cardiovascular:     Rate and Rhythm: Normal rate and regular rhythm.     Heart sounds: Normal heart sounds, S1 normal and S2 normal. No murmur  heard. Pulmonary:     Effort: Pulmonary effort is normal.     Breath sounds: Normal breath sounds. No wheezing, rhonchi or rales.     Comments: Clear to auscultation bilaterally Psychiatric:        Behavior: Behavior is cooperative.      UC Treatments / Results  Labs (all labs ordered are listed, but only abnormal results are displayed) Labs Reviewed - No data to display  EKG   Radiology No results found.  Procedures Procedures (including critical care time)  Medications Ordered in UC Medications - No data to display  Initial Impression / Assessment and Plan / UC Course  I have reviewed the triage vital signs and the nursing notes.  Pertinent labs & imaging results that were available during my care of the patient were reviewed by me and considered in my medical decision making (see chart for details).     Patient is well-appearing, afebrile, nontoxic, nontachycardic.  No indication for viral testing as she has been symptomatic for over a week and this would not change management.  Chest x-ray was deferred as she had no adventitious lung sounds on exam and her oxygen saturation was 98%.  Will cover for sinobronchitis.  Augmentin was suggested based on her metabolic panel from 04/28/2023 with creatinine of 0.79 and calculated creatinine clearance of 58.16 mL/min.  She was encouraged to use over-the-counter medications including Mucinex, Tylenol, fluticasone.  She was given Tessalon for cough.  Recommend that she rest and  drink plenty of fluid.  She is to use nasal saline/sinus rinses for additional symptom relief.  Discussed that if symptoms or not improving within a few days she should return for reevaluation.  If anything worsens she is to be seen immediately.  Strict return precautions given.  Final Clinical Impressions(s) / UC Diagnoses   Final diagnoses:  Sinobronchitis  Acute cough  Nasal congestion     Discharge Instructions      We are treating you for a  sinus/bronchitis infection.  Take Augmentin twice daily for 7 days.  Take this with food as it can upset your stomach.  Use fluticasone nasal spray to help with your congestion as well as benzonatate for cough.  You can use over-the-counter medications including Mucinex for additional symptom relief.  I also recommend nasal saline and sinus rinses as well as drinking plenty of fluid.  If your symptoms are not improving quickly follow-up with either our clinic or primary care.  If anything worsens and you have chest pain, shortness of breath, fever, nausea/vomiting you need to be seen immediately.     ED Prescriptions     Medication Sig Dispense Auth. Provider   amoxicillin-clavulanate (AUGMENTIN) 500-125 MG tablet Take 1 tablet by mouth in the morning and at bedtime for 7 days. 14 tablet Melanie Openshaw K, PA-C   fluticasone (FLONASE) 50 MCG/ACT nasal spray Place 1 spray into both nostrils daily. 16 g Patti Shorb K, PA-C   benzonatate (TESSALON) 100 MG capsule Take 1 capsule (100 mg total) by mouth every 8 (eight) hours. 21 capsule Eldana Isip K, PA-C      PDMP not reviewed this encounter.   Jeani Hawking, PA-C 08/10/23 1610

## 2023-08-10 NOTE — ED Triage Notes (Signed)
Sore throat started on late last week.  Got worse on Wednesday..  On Thursday low grade fever and cough which produces green phlegm.   I believe it is bronchitis. - Entered by patient

## 2023-08-10 NOTE — Discharge Instructions (Signed)
We are treating you for a sinus/bronchitis infection.  Take Augmentin twice daily for 7 days.  Take this with food as it can upset your stomach.  Use fluticasone nasal spray to help with your congestion as well as benzonatate for cough.  You can use over-the-counter medications including Mucinex for additional symptom relief.  I also recommend nasal saline and sinus rinses as well as drinking plenty of fluid.  If your symptoms are not improving quickly follow-up with either our clinic or primary care.  If anything worsens and you have chest pain, shortness of breath, fever, nausea/vomiting you need to be seen immediately.

## 2023-09-08 ENCOUNTER — Other Ambulatory Visit: Payer: Self-pay | Admitting: Family Medicine

## 2023-09-28 NOTE — Progress Notes (Unsigned)
OFFICE VISIT  09/29/2023  CC:  Chief Complaint  Patient presents with   Blood Pressure Check    Pt brought readings with her today; BP has been fluctuating between 160/70 to 140/70.    Patient is a 77 y.o. female who presents for elevated bp's and anxiety.  HPI: Starting early this month she noted on her routine blood pressure monitoring that her blood pressures were up in the 150s to 160s over 70s to 80s. Continue daily monitoring has continued to show a systolic range of 137-173 and diastolic range 68-87. In the months prior to this her blood pressure was consistently in the 120s over 70s. She did have a sinusitis/bronchitis illness back in September and took some antibiotics and some over-the-counter decongestants but has not been on these in over a month. No change in alcohol intake or exercise or diet. No excessive stress lately.  No change in exercise habits.  She does feel a generalized sense of fullness/discomfort in her jaw region and some fatigue as a result of her high blood pressure.  ROS as above, plus--> no fevers, no CP, no SOB, no wheezing, no cough, no dizziness, no HAs, no rashes, no melena/hematochezia.  No polyuria or polydipsia.  No myalgias or arthralgias.  No focal weakness, paresthesias, or tremors.  No acute vision or hearing abnormalities.  No dysuria or unusual/new urinary urgency or frequency.  No recent changes in lower legs. No n/v/d or abd pain.  No palpitations.      PMP AWARE reviewed today: most recent rx for lorazepam was filled 07/27/23, # 60, rx by me. No red flags.  Past Medical History:  Diagnosis Date   Allergic rhinitis    Basal cell carcinoma    X 3: one on each leg and one on right shoulder   Carpal tunnel syndrome on both sides    11/17/19 NCS/EMG; mild.  Dr. Merlyn Lot   Diverticulosis    Noted on colonoscopies   Family history of colon cancer in father    Age 51.  Next colonoscopy due 04/2022.   Generalized anxiety disorder    History of  adenomatous polyp of colon 2008; 2013; 04/2017   Most recent 05/2022--Recall 5 yrs (Dr. Marcos Eke w/[facility])   Hyperlipidemia, mixed    simva 2016; atorva 05/2019   Hypertension    Mitral valve prolapse    Osteoarthritis    Fingers and hips.   Osteopenia    DEXA 03/2019. T-score -2.0-->rpt 2 yrs (Dr. Renaldo Fiddler)   Palpitations    3d event monitor normal 08/26/19.  +Bradycardia and PVCs, followed by cardiology (BB d/c'd 01/2020).   Raynaud phenomenon     Past Surgical History:  Procedure Laterality Date   ABDOMINAL HYSTERECTOMY  1979   Done for DUB.  Ovaries are still in.  No further paps necessary.   APPENDECTOMY  1979   CARDIOVASCULAR STRESS TEST  2007   Normal stress echo (in [place])   CAROTID DOPPLERS  08/11/2020   normal (cards, Dr. Chales Abrahams)   CATARACT EXTRACTION  07/2013   COLONOSCOPY  12/2011   Recall 5 yrs   COLONOSCOPY  04/21/2017   Tubular adenoma x 1. Repeat in 5 years ([facility]. [facility])   COLONOSCOPY  06/10/2022   adenoma x1. Recall 5 yrs   DEXA  02/2017; 03/2019   03/2019 T-score -2.0.  Repeat 2 yrs (Dr. Vickey Sages, [facility]).   Event monitor  08/26/2019   74 H->no adverse arrhythmias.  Occ PVC and PAT.  TRANSTHORACIC ECHOCARDIOGRAM  08/05/2019   EF 65-70%, grd I DD, mild MR.    Outpatient Medications Prior to Visit  Medication Sig Dispense Refill   acetaminophen (TYLENOL) 500 MG tablet Take 500 mg by mouth every 6 (six) hours as needed.     atorvastatin (LIPITOR) 40 MG tablet TAKE 1 TABLET BY MOUTH EVERY DAY (Patient taking differently: Take 20 mg by mouth daily.) 90 tablet 3   irbesartan (AVAPRO) 75 MG tablet One half tab twice daily 90 tablet 1   LORazepam (ATIVAN) 0.5 MG tablet Take 1 tablet (0.5 mg total) by mouth 2 (two) times daily as needed. 60 tablet 1   OVER THE COUNTER MEDICATION daily. Vitamin B12-Vitamin D3- Vitamin C     PARoxetine (PAXIL) 10 MG tablet Take 1 tablet (10 mg total) by mouth daily. (Patient taking differently: Take 10 mg by mouth every other  day.) 90 tablet 3   UNABLE TO FIND Med Name: [facility] DM Max     benzonatate (TESSALON) 100 MG capsule Take 1 capsule (100 mg total) by mouth every 8 (eight) hours. 21 capsule 0   fluticasone (FLONASE) 50 MCG/ACT nasal spray Place 1 spray into both nostrils daily. 16 g 0   No facility-administered medications prior to visit.    Allergies  Allergen Reactions   Citalopram Other (See Comments)    Heatwaves/hot flashes   Lisinopril Other (See Comments)    Jaw pain   Simvastatin Other (See Comments)    Orthostatic dizziness    Review of Systems  As per HPI  PE:    09/29/2023    1:07 PM 08/10/2023    9:12 AM 08/10/2023    9:07 AM  Vitals with BMI  Height   5\' 2"   Weight 136 lbs 10 oz  132 lbs  BMI   24.14  Systolic 166 134   Diastolic 84 77   Pulse 64 76      Physical Exam  Gen: Alert, well appearing.  Patient is oriented to person, place, time, and situation. AFFECT: pleasant, lucid thought and speech. CV: RRR, no m/r/g.   LUNGS: CTA bilat, nonlabored resps, good aeration in all lung fields. EXT: no clubbing or cyanosis.  no edema.    LABS:  Last metabolic panel Lab Results  Component Value Date   GLUCOSE 96 04/28/2023   NA 134 (L) 04/28/2023   K 4.4 04/28/2023   CL 99 04/28/2023   CO2 27 04/28/2023   BUN 10 04/28/2023   CREATININE 0.79 04/28/2023   GFR 73.34 04/28/2023   CALCIUM 9.2 04/28/2023   PROT 6.5 04/28/2023   ALBUMIN 4.2 04/28/2023   BILITOT 1.1 04/28/2023   ALKPHOS 89 04/28/2023   AST 20 04/28/2023   ALT 25 04/28/2023   IMPRESSION AND PLAN:  Uncontrolled hypertension. We have to be a little careful because she has a history of a 75 mg dose of the irbesartan dropping her blood pressure little too low. Instructions: Take a whole irbesartan 75 mg tab in the morning, take an additional 1/2 tab in the evening if bp not <135/85. Every day you may increase by 1/2 tab at each dose until max of 300 mg irbesartan in one day. Urinalysis today shows no  protein or blood. Check basic metabolic panel today.    An After Visit Summary was printed and given to the patient.  FOLLOW UP: Return in about 1 week (around 10/06/2023) for f/u HTN.  Signed:  Santiago Bumpers, MD  09/29/2023  

## 2023-09-29 ENCOUNTER — Encounter: Payer: Self-pay | Admitting: Family Medicine

## 2023-09-29 ENCOUNTER — Ambulatory Visit (INDEPENDENT_AMBULATORY_CARE_PROVIDER_SITE_OTHER): Payer: Medicare HMO | Admitting: Family Medicine

## 2023-09-29 VITALS — BP 166/84 | HR 64 | Wt 136.6 lb

## 2023-09-29 DIAGNOSIS — I10 Essential (primary) hypertension: Secondary | ICD-10-CM

## 2023-09-29 DIAGNOSIS — Z23 Encounter for immunization: Secondary | ICD-10-CM

## 2023-09-29 LAB — POCT URINALYSIS DIPSTICK
Bilirubin, UA: NEGATIVE
Blood, UA: NEGATIVE
Glucose, UA: NEGATIVE
Ketones, UA: NEGATIVE
Leukocytes, UA: NEGATIVE
Nitrite, UA: NEGATIVE
Protein, UA: NEGATIVE
Spec Grav, UA: <=1.005 — AB (ref 1.010–1.025)
Urobilinogen, UA: NEGATIVE U/dL — AB
pH, UA: 6.5 (ref 5.0–8.0)

## 2023-09-29 NOTE — Patient Instructions (Signed)
Take a whole irbesartan 75 mg tab in the morning, take an additional 1/2 tab in the evening if bp not <135/85. Every day you may increase by 1/2 tab at each dose until max of 300 mg irbesartan in one day.

## 2023-09-30 LAB — BASIC METABOLIC PANEL
BUN: 10 mg/dL (ref 7–25)
CO2: 30 mmol/L (ref 20–32)
Calcium: 9.6 mg/dL (ref 8.6–10.4)
Chloride: 103 mmol/L (ref 98–110)
Creat: 0.89 mg/dL (ref 0.60–1.00)
Glucose, Bld: 74 mg/dL (ref 65–99)
Potassium: 4.3 mmol/L (ref 3.5–5.3)
Sodium: 140 mmol/L (ref 135–146)

## 2023-10-06 ENCOUNTER — Encounter: Payer: Self-pay | Admitting: Family Medicine

## 2023-10-06 ENCOUNTER — Ambulatory Visit: Payer: Medicare HMO | Admitting: Family Medicine

## 2023-10-06 VITALS — BP 136/86 | HR 60 | Wt 134.2 lb

## 2023-10-06 DIAGNOSIS — I10 Essential (primary) hypertension: Secondary | ICD-10-CM

## 2023-10-06 NOTE — Progress Notes (Signed)
OFFICE VISIT  10/06/2023  CC:  Chief Complaint  Patient presents with   Hypertension    F/U. Pt brought at-home readings with her today.     Patient is a 77 y.o. female who presents for 1 wk f/u uncontrolled HTN. A/P as of last visit: "Uncontrolled hypertension. We have to be a little careful because she has a history of a 75 mg dose of the irbesartan dropping her blood pressure little too low. Instructions: Take a whole irbesartan 75 mg tab in the morning, take an additional 1/2 tab in the evening if bp not <135/85. Every day you may increase by 1/2 tab at each dose until max of 300 mg irbesartan in one day. Urinalysis today shows no protein or blood. Check basic metabolic panel today. "  INTERIM HX: Labs normal last visit. Carnie feels much better. Her home blood pressures are in the 130s over 70s to 80s. She is currently on 1-1/2 of her 75 mg and irbesartan tabs in the morning and 1/2 tab in the evenings.   Past Medical History:  Diagnosis Date   Allergic rhinitis    Basal cell carcinoma    X 3: one on each leg and one on right shoulder   Carpal tunnel syndrome on both sides    11/17/19 NCS/EMG; mild.  Dr. Merlyn Lot   Diverticulosis    Noted on colonoscopies   Family history of colon cancer in father    Age 53.  Next colonoscopy due 04/2022.   Generalized anxiety disorder    History of adenomatous polyp of colon 2008; 2013; 04/2017   Most recent 05/2022--Recall 5 yrs (Dr. Marcos Eke w/[facility])   Hyperlipidemia, mixed    simva 2016; atorva 05/2019   Hypertension    Mitral valve prolapse    Osteoarthritis    Fingers and hips.   Osteopenia    DEXA 03/2019. T-score -2.0-->rpt 2 yrs (Dr. Renaldo Fiddler)   Palpitations    3d event monitor normal 08/26/19.  +Bradycardia and PVCs, followed by cardiology (BB d/c'd 01/2020).   Raynaud phenomenon     Past Surgical History:  Procedure Laterality Date   ABDOMINAL HYSTERECTOMY  1979   Done for DUB.  Ovaries are still in.  No further paps  necessary.   APPENDECTOMY  1979   CARDIOVASCULAR STRESS TEST  2007   Normal stress echo (in [place])   CAROTID DOPPLERS  08/11/2020   normal (cards, Dr. Chales Abrahams)   CATARACT EXTRACTION  07/2013   COLONOSCOPY  12/2011   Recall 5 yrs   COLONOSCOPY  04/21/2017   Tubular adenoma x 1. Repeat in 5 years ([facility]. [facility])   COLONOSCOPY  06/10/2022   adenoma x1. Recall 5 yrs   DEXA  02/2017; 03/2019   03/2019 T-score -2.0.  Repeat 2 yrs (Dr. Vickey Sages, [facility]).   Event monitor  08/26/2019   74 H->no adverse arrhythmias.  Occ PVC and PAT.   TRANSTHORACIC ECHOCARDIOGRAM  08/05/2019   EF 65-70%, grd I DD, mild MR.    Outpatient Medications Prior to Visit  Medication Sig Dispense Refill   acetaminophen (TYLENOL) 500 MG tablet Take 500 mg by mouth every 6 (six) hours as needed.     atorvastatin (LIPITOR) 40 MG tablet TAKE 1 TABLET BY MOUTH EVERY DAY (Patient taking differently: Take 20 mg by mouth daily.) 90 tablet 3   irbesartan (AVAPRO) 75 MG tablet One half tab twice daily 90 tablet 1   LORazepam (ATIVAN) 0.5 MG tablet Take 1 tablet (0.5  mg total) by mouth 2 (two) times daily as needed. 60 tablet 1   OVER THE COUNTER MEDICATION daily. Vitamin B12-Vitamin D3- Vitamin C     PARoxetine (PAXIL) 10 MG tablet Take 1 tablet (10 mg total) by mouth daily. (Patient taking differently: Take 10 mg by mouth every other day.) 90 tablet 3   No facility-administered medications prior to visit.    Allergies  Allergen Reactions   Citalopram Other (See Comments)    Heatwaves/hot flashes   Lisinopril Other (See Comments)    Jaw pain   Simvastatin Other (See Comments)    Orthostatic dizziness    Review of Systems As per HPI  PE:    10/06/2023    8:23 AM 09/29/2023    1:07 PM 08/10/2023    9:12 AM  Vitals with BMI  Weight 134 lbs 3 oz 136 lbs 10 oz   Systolic 160 166 962  Diastolic 86 84 77  Pulse 60 64 76   Manual repeat blood pressure today was 136/86.  Physical Exam  Gen: Alert,  well appearing.  Patient is oriented to person, place, time, and situation. No further exam today  LABS:  Last metabolic panel Lab Results  Component Value Date   GLUCOSE 74 09/29/2023   NA 140 09/29/2023   K 4.3 09/29/2023   CL 103 09/29/2023   CO2 30 09/29/2023   BUN 10 09/29/2023   CREATININE 0.89 09/29/2023   GFR 73.34 04/28/2023   CALCIUM 9.6 09/29/2023   PROT 6.5 04/28/2023   ALBUMIN 4.2 04/28/2023   BILITOT 1.1 04/28/2023   ALKPHOS 89 04/28/2023   AST 20 04/28/2023   ALT 25 04/28/2023   IMPRESSION AND PLAN:  #1 hypertension, much improved control on increase of her irbesartan. Continue 1-1/2 of the 75 mg tabs in the morning and increase to 1 whole tab in the evening. I see her back next month for her physical.  An After Visit Summary was printed and given to the patient.  FOLLOW UP: No follow-ups on file.  Signed:  Santiago Bumpers, MD           10/06/2023

## 2023-10-28 ENCOUNTER — Encounter: Payer: Self-pay | Admitting: Family Medicine

## 2023-10-28 ENCOUNTER — Ambulatory Visit: Payer: Medicare HMO | Admitting: Family Medicine

## 2023-10-28 VITALS — BP 144/82 | HR 56 | Ht 62.0 in | Wt 135.8 lb

## 2023-10-28 DIAGNOSIS — E7800 Pure hypercholesterolemia, unspecified: Secondary | ICD-10-CM | POA: Diagnosis not present

## 2023-10-28 DIAGNOSIS — I10 Essential (primary) hypertension: Secondary | ICD-10-CM

## 2023-10-28 DIAGNOSIS — F411 Generalized anxiety disorder: Secondary | ICD-10-CM | POA: Diagnosis not present

## 2023-10-28 DIAGNOSIS — Z79899 Other long term (current) drug therapy: Secondary | ICD-10-CM | POA: Diagnosis not present

## 2023-10-28 DIAGNOSIS — Z0000 Encounter for general adult medical examination without abnormal findings: Secondary | ICD-10-CM

## 2023-10-28 LAB — COMPREHENSIVE METABOLIC PANEL
ALT: 17 U/L (ref 0–35)
AST: 17 U/L (ref 0–37)
Albumin: 4.1 g/dL (ref 3.5–5.2)
Alkaline Phosphatase: 77 U/L (ref 39–117)
BUN: 14 mg/dL (ref 6–23)
CO2: 31 meq/L (ref 19–32)
Calcium: 9.1 mg/dL (ref 8.4–10.5)
Chloride: 103 meq/L (ref 96–112)
Creatinine, Ser: 0.83 mg/dL (ref 0.40–1.20)
GFR: 68.88 mL/min (ref >60.00)
Glucose, Bld: 95 mg/dL (ref 70–99)
Potassium: 4.5 meq/L (ref 3.5–5.1)
Sodium: 138 meq/L (ref 135–145)
Total Bilirubin: 1 mg/dL (ref 0.2–1.2)
Total Protein: 6 g/dL (ref 6.0–8.3)

## 2023-10-28 LAB — CBC
HCT: 41.1 % (ref 36.0–46.0)
Hemoglobin: 14 g/dL (ref 12.0–15.0)
MCHC: 34 g/dL (ref 30.0–36.0)
MCV: 93.3 fL (ref 78.0–100.0)
Platelets: 197 10*3/uL (ref 150.0–400.0)
RBC: 4.4 Mil/uL (ref 3.87–5.11)
RDW: 13.3 % (ref 11.5–15.5)
WBC: 5.8 10*3/uL (ref 4.0–10.5)

## 2023-10-28 LAB — LIPID PANEL
Cholesterol: 170 mg/dL (ref 0–200)
HDL: 52.4 mg/dL (ref >39.00)
LDL Cholesterol: 93 mg/dL (ref 0–99)
NonHDL: 117.17
Total CHOL/HDL Ratio: 3
Triglycerides: 122 mg/dL (ref 0.0–149.0)
VLDL: 24.4 mg/dL (ref 0.0–40.0)

## 2023-10-28 MED ORDER — HYDROCHLOROTHIAZIDE 12.5 MG PO TABS: 12.5000 mg | ORAL_TABLET | Freq: Every day | ORAL | 0 refills | Status: DC

## 2023-10-28 MED ORDER — LORAZEPAM 0.5 MG PO TABS: 0.5000 mg | ORAL_TABLET | Freq: Two times a day (BID) | ORAL | 2 refills | Status: AC | PRN

## 2023-10-28 MED ORDER — IRBESARTAN 300 MG PO TABS: 300.0000 mg | ORAL_TABLET | Freq: Every day | ORAL | 0 refills | Status: DC

## 2023-10-28 MED ORDER — PAROXETINE HCL 10 MG PO TABS: 10.0000 mg | ORAL_TABLET | ORAL | 3 refills | Status: AC

## 2023-10-28 NOTE — Progress Notes (Signed)
Office Note 10/28/2023  CC:  Chief Complaint  Patient presents with   Annual Exam    Pt is fasting.    Patient is a 77 y.o. female who is here for annual health maintenance exam and follow-up hypertension, hyperlipidemia, and GAD with high risk medication use.. I last saw her 10/06/23. A/P as of that visit: "#1 hypertension, much improved control on increase of her irbesartan. Continue 1-1/2 of the 75 mg tabs in the morning and increase to 1 whole tab in the evening. I see her back next month for her physical."  INTERIM HX: Feeling very well.   Home blood pressures still averaging around 140 over mid 80s. No heart rate data.  PMP AWARE reviewed today: most recent rx for lorazepam was filled 07/27/2023, # 60, rx by me. No red flags.  Past Medical History:  Diagnosis Date   Allergic rhinitis    Basal cell carcinoma    X 3: one on each leg and one on right shoulder   Carpal tunnel syndrome on both sides    11/17/19 NCS/EMG; mild.  Dr. Merlyn Lot   Diverticulosis    Noted on colonoscopies   Family history of colon cancer in father    Age 66.  Next colonoscopy due 04/2022.   Generalized anxiety disorder    History of adenomatous polyp of colon 2008; 2013; 04/2017   Most recent 05/2022--Recall 5 yrs (Dr. Marcos Eke w/[facility])   Hyperlipidemia, mixed    simva 2016; atorva 05/2019   Hypertension    Mitral valve prolapse    Osteoarthritis    Fingers and hips.   Osteopenia    DEXA 03/2019. T-score -2.0-->rpt 2 yrs (Dr. Renaldo Fiddler)   Palpitations    3d event monitor normal 08/26/19.  +Bradycardia and PVCs, followed by cardiology (BB d/c'd 01/2020).   Raynaud phenomenon     Past Surgical History:  Procedure Laterality Date   ABDOMINAL HYSTERECTOMY  1979   Done for DUB.  Ovaries are still in.  No further paps necessary.   APPENDECTOMY  1979   CARDIOVASCULAR STRESS TEST  2007   Normal stress echo (in [place])   CAROTID DOPPLERS  08/11/2020   normal (cards, Dr. Chales Abrahams)   CATARACT  EXTRACTION  07/2013   COLONOSCOPY  12/2011   Recall 5 yrs   COLONOSCOPY  04/21/2017   Tubular adenoma x 1. Repeat in 5 years (Dr. [facility])   COLONOSCOPY  06/10/2022   adenoma x1. Recall 5 yrs   DEXA  02/2017; 03/2019   03/2019 T-score -2.0.  Repeat 2 yrs (Dr. Vickey Sages, [facility]).   Event monitor  08/26/2019   74 H->no adverse arrhythmias.  Occ PVC and PAT.   TRANSTHORACIC ECHOCARDIOGRAM  08/05/2019   EF 65-70%, grd I DD, mild MR.    Family History  Problem Relation Age of Onset   Breast cancer Mother        dx'd age 42   Heart attack Mother    Heart disease Mother    Stroke Father    Hypertension Father    Cancer Father    Hypertension Sister    Cancer Brother    Hypertension Brother    Heart attack Brother    Pancreatitis Brother    Hypertension Sister    Myasthenia gravis Sister     Social History   Socioeconomic History   Marital status: Married    Spouse name: Not on file   Number of children: Not on file   Years of  education: Not on file   Highest education level: 12th grade  Occupational History   Not on file  Tobacco Use   Smoking status: Never   Smokeless tobacco: Never  Vaping Use   Vaping status: Never Used  Substance and Sexual Activity   Alcohol use: Yes    Alcohol/week: 1.0 standard drink of alcohol    Types: 1 Glasses of wine per week    Comment: 4-5 times weekly   Drug use: No   Sexual activity: Not Currently  Other Topics Concern   Not on file  Social History Narrative   Married, 1 son, 1 grand-daughter.   Orig from [place], then [place] for 30 yrs, relocated back to [place] [facility] 2015.   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   NO tob.  Alc: 1 glass wine 4-5 times per week.   Social Drivers of Corporate investment banker Strain: Low Risk  (09/26/2023)   Overall Financial Resource Strain (CARDIA)    Difficulty of Paying Living Expenses: Not hard at all  Food Insecurity: No Food Insecurity  (09/26/2023)   Hunger Vital Sign    Worried About Running Out of Food in the Last Year: Never true    Ran Out of Food in the Last Year: Never true  Transportation Needs: No Transportation Needs (09/26/2023)   PRAPARE - Administrator, Civil Service (Medical): No    Lack of Transportation (Non-Medical): No  Physical Activity: Insufficiently Active (09/26/2023)   Exercise Vital Sign    Days of Exercise per Week: 3 days    Minutes of Exercise per Session: 30 min  Stress: No Stress Concern Present (09/26/2023)   [employer] of Occupational Health - Occupational Stress Questionnaire    Feeling of Stress : Only a little  Social Connections: Moderately Integrated (09/26/2023)   Social Connection and Isolation Panel [NHANES]    Frequency of Communication with Friends and Family: More than three times a week    Frequency of Social Gatherings with Friends and Family: Twice a week    Attends Religious Services: More than 4 times per year    Active Member of [employer] or Organizations: No    Attends Banker Meetings: Never    Marital Status: Married  Catering manager Violence: Not At Risk (04/28/2023)   Humiliation, Afraid, Rape, and Kick questionnaire    Fear of Current or Ex-Partner: No    Emotionally Abused: No    Physically Abused: No    Sexually Abused: No    Outpatient Medications Prior to Visit  Medication Sig Dispense Refill   acetaminophen (TYLENOL) 500 MG tablet Take 500 mg by mouth every 6 (six) hours as needed.     atorvastatin (LIPITOR) 40 MG tablet TAKE 1 TABLET BY MOUTH EVERY DAY (Patient taking differently: Take 20 mg by mouth daily.) 90 tablet 3   irbesartan (AVAPRO) 75 MG tablet One half tab twice daily 90 tablet 1   LORazepam (ATIVAN) 0.5 MG tablet Take 1 tablet (0.5 mg total) by mouth 2 (two) times daily as needed. 60 tablet 1   OVER THE COUNTER MEDICATION daily. Vitamin B12-Vitamin D3- Vitamin C     PARoxetine (PAXIL) 10 MG tablet Take 1 tablet  (10 mg total) by mouth daily. (Patient taking differently: Take 10 mg by mouth every other day.) 90 tablet 3   No facility-administered medications prior to visit.    Allergies  Allergen Reactions   Citalopram Other (  See Comments)    Heatwaves/hot flashes   Lisinopril Other (See Comments)    Jaw pain   Simvastatin Other (See Comments)    Orthostatic dizziness    Review of Systems  Constitutional:  Negative for appetite change, chills, fatigue and fever.  HENT:  Negative for congestion, dental problem, ear pain and sore throat.   Eyes:  Negative for discharge, redness and visual disturbance.  Respiratory:  Negative for cough, chest tightness, shortness of breath and wheezing.   Cardiovascular:  Negative for chest pain, palpitations and leg swelling.  Gastrointestinal:  Negative for abdominal pain, blood in stool, diarrhea, nausea and vomiting.  Genitourinary:  Negative for difficulty urinating, dysuria, flank pain, frequency, hematuria and urgency.  Musculoskeletal:  Negative for arthralgias, back pain, joint swelling, myalgias and neck stiffness.  Skin:  Negative for pallor and rash.  Neurological:  Negative for dizziness, speech difficulty, weakness and headaches.  Hematological:  Negative for adenopathy. Does not bruise/bleed easily.  Psychiatric/Behavioral:  Negative for confusion and sleep disturbance. The patient is not nervous/anxious.     PE;    10/28/2023    8:41 AM 10/28/2023    8:34 AM 10/06/2023   10:18 AM  Vitals with BMI  Height  5\' 2"    Weight  135 lbs 13 oz   BMI  24.83   Systolic 144 143 161  Diastolic 82 81 86  Pulse  56      Exam chaperoned by Cloe Motsinger, CMA Gen: Alert, well appearing.  Patient is oriented to person, place, time, and situation. AFFECT: pleasant, lucid thought and speech. ENT: Ears: EACs clear, normal epithelium.  TMs with good light reflex and landmarks bilaterally.  Eyes: no injection, icteris, swelling, or exudate.  EOMI,  PERRLA. Nose: no drainage or turbinate edema/swelling.  No injection or focal lesion.  Mouth: lips without lesion/swelling.  Oral mucosa pink and moist.  Dentition intact and without obvious caries or gingival swelling.  Oropharynx without erythema, exudate, or swelling.  Neck: supple/nontender.  No LAD, mass, or TM.  Carotid pulses 2+ bilaterally, without bruits. CV: RRR, no m/r/g.   LUNGS: CTA bilat, nonlabored resps, good aeration in all lung fields. ABD: soft, NT, ND, BS normal.  No hepatospenomegaly or mass.  No bruits. EXT: no clubbing, cyanosis, or edema.  Musculoskeletal: no joint swelling, erythema, warmth, or tenderness.  ROM of all joints intact. Skin - no sores or suspicious lesions or rashes or color changes  Pertinent labs:  Lab Results  Component Value Date   TSH 2.50 10/29/2022   Lab Results  Component Value Date   WBC 6.4 10/29/2022   HGB 14.6 10/29/2022   HCT 41.4 10/29/2022   MCV 88.1 10/29/2022   PLT 195 10/29/2022   Lab Results  Component Value Date   CREATININE 0.89 09/29/2023   BUN 10 09/29/2023   NA 140 09/29/2023   K 4.3 09/29/2023   CL 103 09/29/2023   CO2 30 09/29/2023   Lab Results  Component Value Date   ALT 25 04/28/2023   AST 20 04/28/2023   ALKPHOS 89 04/28/2023   BILITOT 1.1 04/28/2023   Lab Results  Component Value Date   CHOL 163 04/28/2023   Lab Results  Component Value Date   HDL 55.60 04/28/2023   Lab Results  Component Value Date   LDLCALC 91 04/28/2023   Lab Results  Component Value Date   TRIG 82.0 04/28/2023   Lab Results  Component Value Date   CHOLHDL 3 04/28/2023  ASSESSMENT AND PLAN:   #1 health maintenance exam: Reviewed age and gender appropriate health maintenance issues (prudent diet, regular exercise, health risks of tobacco and excessive alcohol, use of seatbelts, fire alarms in home, use of sunscreen).  Also reviewed age and gender appropriate health screening as well as vaccine  recommendations. Vaccines: all UTD. Labs: cmet,cbc,flp Cervical ca screening: remote hx of hysterectomy for DUB, no further paps indicated. Breast ca screening: mammogram via [facility].  Mammogram set for November 24, 2023. Colon ca screening: hx of adenomas, recall 05/2027. Osteoporosis screening: last DEXA 04/2019, osteopenia, [facility]->rpt 08/2021 was stable--> repeat set for November 24, 2023.  She is taking Ca++, 1000 U vit D daily.  #2 hypertension, not controlled. Increase the irbesartan to 300 mg a day.  Add HCTZ 12.5 mg a day if blood pressures not less than 130/80 after 3 days. If needed, increase HCTZ to TWO of the 12.5 mg tabs daily. Electrolytes and creatinine monitoring today.  #3 hyperlipidemia, patient reports taking half of her a atorvastatin 40 mg tab daily. Monitor lipids today.  4.  GAD.  She has been doing well long-term on Paxil 10 mg a day and she uses lorazepam 0.5 mg twice daily as needed.  An After Visit Summary was printed and given to the patient.  FOLLOW UP:  No follow-ups on file.  Signed:  Santiago Bumpers, MD           10/28/2023

## 2023-10-28 NOTE — Patient Instructions (Signed)
Start irbesartan 300 mg once daily.  If blood pressure still >130/80 after 3 days then start hydrochlorothiazide 12.5 mg one tab daily in addition to your irbesartan.  If pressure still >130/80 after 3 days then increase to TWO of the hydrochlorothiazide 12.5mg  tabs every day.

## 2023-11-04 ENCOUNTER — Other Ambulatory Visit: Payer: Self-pay | Admitting: Family Medicine

## 2023-11-18 ENCOUNTER — Ambulatory Visit (INDEPENDENT_AMBULATORY_CARE_PROVIDER_SITE_OTHER): Payer: Medicare HMO | Admitting: Family Medicine

## 2023-11-18 ENCOUNTER — Encounter: Payer: Self-pay | Admitting: Family Medicine

## 2023-11-18 VITALS — BP 132/79 | HR 57 | Wt 135.2 lb

## 2023-11-18 DIAGNOSIS — I10 Essential (primary) hypertension: Secondary | ICD-10-CM | POA: Diagnosis not present

## 2023-11-18 MED ORDER — HYDROCHLOROTHIAZIDE 12.5 MG PO TABS: 12.5000 mg | ORAL_TABLET | Freq: Every day | ORAL | 3 refills | Status: AC

## 2023-11-18 MED ORDER — IRBESARTAN 300 MG PO TABS: 300.0000 mg | ORAL_TABLET | Freq: Every day | ORAL | 3 refills | Status: AC

## 2023-11-18 NOTE — Progress Notes (Signed)
 OFFICE VISIT  11/18/2023  CC:  Chief Complaint  Patient presents with   Hypertension    Pt has at home readings with her today.    Patient is a 78 y.o. female who presents for 3-week follow-up hypertension. A/P as of last visit: hypertension, not controlled. Increase the irbesartan  to 300 mg a day.  Add HCTZ 12.5 mg a day if blood pressures not less than 130/80 after 3 days. If needed, increase HCTZ to TWO of the 12.5 mg tabs daily. Electrolytes and creatinine monitoring today.  INTERIM HX: Feeling well. Home bp's excellent--->avg <130/80, HR 70 avg  Past Medical History:  Diagnosis Date   Allergic rhinitis    Basal cell carcinoma    X 3: one on each leg and one on right shoulder   Carpal tunnel syndrome on both sides    11/17/19 NCS/EMG; mild.  Dr. Murrell   Diverticulosis    Noted on colonoscopies   Family history of colon cancer in father    Age 86.  Next colonoscopy due 04/2022.   Generalized anxiety disorder    History of adenomatous polyp of colon 2008; 2013; 04/2017   Most recent 05/2022--Recall 5 yrs (Dr. Estelita w/[facility])   Hyperlipidemia, mixed    simva 2016; atorva 05/2019   Hypertension    Mitral valve prolapse    Osteoarthritis    Fingers and hips.   Osteopenia    DEXA 03/2019. T-score -2.0-->rpt 2 yrs (Dr. Latisha)   Palpitations    3d event monitor normal 08/26/19.  +Bradycardia and PVCs, followed by cardiology (BB d/c'd 01/2020).   Raynaud phenomenon     Past Surgical History:  Procedure Laterality Date   ABDOMINAL HYSTERECTOMY  1979   Done for DUB.  Ovaries are still in.  No further paps necessary.   APPENDECTOMY  1979   CARDIOVASCULAR STRESS TEST  2007   Normal stress echo (in [place] )   CAROTID DOPPLERS  08/11/2020   normal (cards, Dr. Lilian)   CATARACT EXTRACTION  07/2013   COLONOSCOPY  12/2011   Recall 5 yrs   COLONOSCOPY  04/21/2017   Tubular adenoma x 1. Repeat in 5 years ([facility]. [facility])   COLONOSCOPY  06/10/2022   adenoma x1. Recall 5  yrs   DEXA  02/2017; 03/2019   03/2019 T-score -2.0.  Repeat 2 yrs (Dr. German, [facility]).   Event monitor  08/26/2019   74 H->no adverse arrhythmias.  Occ PVC and PAT.   TRANSTHORACIC ECHOCARDIOGRAM  08/05/2019   EF 65-70%, grd I DD, mild MR.    Outpatient Medications Prior to Visit  Medication Sig Dispense Refill   acetaminophen  (TYLENOL ) 500 MG tablet Take 500 mg by mouth every 6 (six) hours as needed.     atorvastatin  (LIPITOR) 40 MG tablet TAKE 1 TABLET BY MOUTH EVERY DAY (Patient taking differently: Take 20 mg by mouth daily.) 90 tablet 3   hydrochlorothiazide  (HYDRODIURIL ) 12.5 MG tablet Take 1 tablet (12.5 mg total) by mouth daily. 30 tablet 0   irbesartan  (AVAPRO ) 300 MG tablet Take 1 tablet (300 mg total) by mouth daily. 30 tablet 0   irbesartan  (AVAPRO ) 75 MG tablet One half tab twice daily 90 tablet 1   LORazepam  (ATIVAN ) 0.5 MG tablet Take 1 tablet (0.5 mg total) by mouth 2 (two) times daily as needed. 60 tablet 2   OVER THE COUNTER MEDICATION daily. Vitamin B12-Vitamin D3- Vitamin C     PARoxetine  (PAXIL ) 10 MG tablet Take 1 tablet (10  mg total) by mouth every other day. 90 tablet 3   No facility-administered medications prior to visit.    Allergies  Allergen Reactions   Citalopram  Other (See Comments)    Heatwaves/hot flashes   Lisinopril  Other (See Comments)    Jaw pain   Simvastatin  Other (See Comments)    Orthostatic dizziness    Review of Systems As per HPI  PE:    11/18/2023    8:16 AM 10/28/2023    8:41 AM 10/28/2023    8:34 AM  Vitals with BMI  Height   5' 2  Weight 135 lbs 3 oz  135 lbs 13 oz  BMI 24.72  24.83  Systolic 132 144 856  Diastolic 79 82 81  Pulse 57  56     Physical Exam  Gen: Alert, well appearing.  Patient is oriented to person, place, time, and situation. AFFECT: pleasant, lucid thought and speech. No further exam today.  LABS:  Last CBC Lab Results  Component Value Date   WBC 5.8 10/28/2023   HGB 14.0 10/28/2023    HCT 41.1 10/28/2023   MCV 93.3 10/28/2023   MCH 31.1 10/29/2022   RDW 13.3 10/28/2023   PLT 197.0 10/28/2023   Last metabolic panel Lab Results  Component Value Date   GLUCOSE 95 10/28/2023   NA 138 10/28/2023   K 4.5 10/28/2023   CL 103 10/28/2023   CO2 31 10/28/2023   BUN 14 10/28/2023   CREATININE 0.83 10/28/2023   GFR 68.88 10/28/2023   CALCIUM  9.1 10/28/2023   PROT 6.0 10/28/2023   ALBUMIN 4.1 10/28/2023   BILITOT 1.0 10/28/2023   ALKPHOS 77 10/28/2023   AST 17 10/28/2023   ALT 17 10/28/2023   Last lipids Lab Results  Component Value Date   CHOL 170 10/28/2023   HDL 52.40 10/28/2023   LDLCALC 93 10/28/2023   TRIG 122.0 10/28/2023   CHOLHDL 3 10/28/2023   Last thyroid  functions Lab Results  Component Value Date   TSH 2.50 10/29/2022   IMPRESSION AND PLAN:  Hypertension, controlled on the irbesartan  300 mg a day and HCTZ 12.5 mg a day. No changes today.  An After Visit Summary was printed and given to the patient.  FOLLOW UP: No follow-ups on file. Next CPE 10/2024 Signed:  Gerlene Hockey, MD           11/18/2023

## 2023-11-24 DIAGNOSIS — Z6825 Body mass index (BMI) 25.0-25.9, adult: Secondary | ICD-10-CM | POA: Diagnosis not present

## 2023-11-24 DIAGNOSIS — Z01419 Encounter for gynecological examination (general) (routine) without abnormal findings: Secondary | ICD-10-CM | POA: Diagnosis not present

## 2023-11-24 DIAGNOSIS — Z1231 Encounter for screening mammogram for malignant neoplasm of breast: Secondary | ICD-10-CM | POA: Diagnosis not present

## 2023-11-24 DIAGNOSIS — M8588 Other specified disorders of bone density and structure, other site: Secondary | ICD-10-CM | POA: Diagnosis not present

## 2024-01-23 DIAGNOSIS — Z83518 Family history of other specified eye disorder: Secondary | ICD-10-CM | POA: Diagnosis not present

## 2024-01-23 DIAGNOSIS — Z961 Presence of intraocular lens: Secondary | ICD-10-CM | POA: Diagnosis not present

## 2024-01-23 DIAGNOSIS — H04123 Dry eye syndrome of bilateral lacrimal glands: Secondary | ICD-10-CM | POA: Diagnosis not present

## 2024-01-23 DIAGNOSIS — H26493 Other secondary cataract, bilateral: Secondary | ICD-10-CM | POA: Diagnosis not present

## 2024-01-23 DIAGNOSIS — H35373 Puckering of macula, bilateral: Secondary | ICD-10-CM | POA: Diagnosis not present

## 2024-04-14 ENCOUNTER — Other Ambulatory Visit: Payer: Self-pay | Admitting: Family Medicine

## 2024-05-04 DIAGNOSIS — L309 Dermatitis, unspecified: Secondary | ICD-10-CM | POA: Diagnosis not present

## 2024-05-04 DIAGNOSIS — Z85828 Personal history of other malignant neoplasm of skin: Secondary | ICD-10-CM | POA: Diagnosis not present

## 2024-05-09 ENCOUNTER — Other Ambulatory Visit: Payer: Self-pay | Admitting: Family Medicine

## 2024-06-29 DIAGNOSIS — Z85828 Personal history of other malignant neoplasm of skin: Secondary | ICD-10-CM | POA: Diagnosis not present

## 2024-06-29 DIAGNOSIS — L309 Dermatitis, unspecified: Secondary | ICD-10-CM | POA: Diagnosis not present

## 2024-06-29 DIAGNOSIS — L821 Other seborrheic keratosis: Secondary | ICD-10-CM | POA: Diagnosis not present

## 2024-06-29 DIAGNOSIS — L570 Actinic keratosis: Secondary | ICD-10-CM | POA: Diagnosis not present

## 2024-06-29 DIAGNOSIS — L820 Inflamed seborrheic keratosis: Secondary | ICD-10-CM | POA: Diagnosis not present

## 2024-06-29 DIAGNOSIS — L920 Granuloma annulare: Secondary | ICD-10-CM | POA: Diagnosis not present

## 2024-08-10 DIAGNOSIS — F411 Generalized anxiety disorder: Secondary | ICD-10-CM | POA: Diagnosis not present

## 2024-08-10 DIAGNOSIS — Z1330 Encounter for screening examination for mental health and behavioral disorders, unspecified: Secondary | ICD-10-CM | POA: Diagnosis not present

## 2024-08-10 DIAGNOSIS — E7800 Pure hypercholesterolemia, unspecified: Secondary | ICD-10-CM | POA: Diagnosis not present

## 2024-08-10 DIAGNOSIS — I10 Essential (primary) hypertension: Secondary | ICD-10-CM | POA: Diagnosis not present

## 2024-08-10 DIAGNOSIS — Z23 Encounter for immunization: Secondary | ICD-10-CM | POA: Diagnosis not present

## 2024-11-02 DIAGNOSIS — I10 Essential (primary) hypertension: Secondary | ICD-10-CM | POA: Diagnosis not present

## 2024-11-02 DIAGNOSIS — E7800 Pure hypercholesterolemia, unspecified: Secondary | ICD-10-CM | POA: Diagnosis not present
